# Patient Record
Sex: Male | Born: 1955
Health system: Southern US, Community
[De-identification: ages and names within clinical notes are randomized; demographics above are authoritative.]

## PROBLEM LIST (undated history)

## (undated) DIAGNOSIS — M1990 Unspecified osteoarthritis, unspecified site: Secondary | ICD-10-CM

## (undated) DIAGNOSIS — C61 Malignant neoplasm of prostate: Secondary | ICD-10-CM

## (undated) HISTORY — PX: CARPAL TUNNEL RELEASE: SHX101

## (undated) HISTORY — DX: Unspecified osteoarthritis, unspecified site: M19.90

## (undated) HISTORY — DX: Malignant neoplasm of prostate: C61

## (undated) HISTORY — PX: KNEE ARTHROSCOPY: SUR90

## (undated) HISTORY — PX: MEDIAL PARTIAL KNEE REPLACEMENT: SHX5965

---

## 2008-01-01 ENCOUNTER — Other Ambulatory Visit: Payer: Self-pay

## 2008-01-01 ENCOUNTER — Emergency Department: Payer: Self-pay | Admitting: Emergency Medicine

## 2008-05-11 ENCOUNTER — Emergency Department: Payer: Self-pay | Admitting: Emergency Medicine

## 2010-09-28 HISTORY — PX: BOWEL RESECTION: SHX1257

## 2011-03-28 ENCOUNTER — Emergency Department: Payer: Self-pay | Admitting: Emergency Medicine

## 2012-04-22 ENCOUNTER — Emergency Department: Payer: Self-pay | Admitting: Emergency Medicine

## 2012-04-22 LAB — CBC WITH DIFFERENTIAL/PLATELET
Basophil #: 0.1 10*3/uL (ref 0.0–0.1)
HCT: 44.6 % (ref 40.0–52.0)
Lymphocyte %: 8.6 %
MCHC: 34.6 g/dL (ref 32.0–36.0)
Monocyte %: 4.4 %
Neutrophil %: 86.1 %
Platelet: 170 10*3/uL (ref 150–440)
RDW: 13.1 % (ref 11.5–14.5)
WBC: 13.2 10*3/uL — ABNORMAL HIGH (ref 3.8–10.6)

## 2012-04-22 LAB — URINALYSIS, COMPLETE
Bacteria: NONE SEEN
Blood: NEGATIVE
Glucose,UR: NEGATIVE mg/dL (ref 0–75)
Hyaline Cast: 7
Leukocyte Esterase: NEGATIVE
Nitrite: NEGATIVE
Ph: 5 (ref 4.5–8.0)
WBC UR: 2 /HPF (ref 0–5)

## 2012-04-22 LAB — BASIC METABOLIC PANEL
BUN: 20 mg/dL — ABNORMAL HIGH (ref 7–18)
Calcium, Total: 8.4 mg/dL — ABNORMAL LOW (ref 8.5–10.1)
Co2: 27 mmol/L (ref 21–32)
Creatinine: 1.07 mg/dL (ref 0.60–1.30)
EGFR (African American): >60
EGFR (Non-African Amer.): >60
Glucose: 106 mg/dL — ABNORMAL HIGH (ref 65–99)
Potassium: 4.5 mmol/L (ref 3.5–5.1)
Sodium: 141 mmol/L (ref 136–145)

## 2012-04-22 LAB — DRUG SCREEN, URINE
Amphetamines, Ur Screen: NEGATIVE (ref ?–1000)
Barbiturates, Ur Screen: NEGATIVE (ref ?–200)
Benzodiazepine, Ur Scrn: NEGATIVE (ref ?–200)
Cannabinoid 50 Ng, Ur ~~LOC~~: NEGATIVE (ref ?–50)
Cocaine Metabolite,Ur ~~LOC~~: NEGATIVE (ref ?–300)
MDMA (Ecstasy)Ur Screen: NEGATIVE (ref ?–500)
Methadone, Ur Screen: NEGATIVE (ref ?–300)
Opiate, Ur Screen: NEGATIVE (ref ?–300)
Tricyclic, Ur Screen: NEGATIVE (ref ?–1000)

## 2012-04-22 LAB — CK TOTAL AND CKMB (NOT AT ARMC)
CK, Total: 130 U/L (ref 35–232)
CK-MB: 2 ng/mL (ref 0.5–3.6)

## 2013-10-18 ENCOUNTER — Observation Stay: Payer: Self-pay | Admitting: Surgery

## 2013-10-18 LAB — CBC WITH DIFFERENTIAL/PLATELET
BASOS ABS: 0.1 10*3/uL (ref 0.0–0.1)
BASOS PCT: 0.5 %
Eosinophil #: 0 10*3/uL (ref 0.0–0.7)
Eosinophil %: 0.1 %
HCT: 46.2 % (ref 40.0–52.0)
HGB: 15.2 g/dL (ref 13.0–18.0)
Lymphocyte #: 1.4 10*3/uL (ref 1.0–3.6)
Lymphocyte %: 11 %
MCH: 30.6 pg (ref 26.0–34.0)
MCHC: 33 g/dL (ref 32.0–36.0)
MCV: 93 fL (ref 80–100)
MONO ABS: 0.5 x10 3/mm (ref 0.2–1.0)
Monocyte %: 3.7 %
NEUTROS ABS: 10.6 10*3/uL — AB (ref 1.4–6.5)
Neutrophil %: 84.7 %
PLATELETS: 190 10*3/uL (ref 150–440)
RBC: 4.98 10*6/uL (ref 4.40–5.90)
RDW: 12.7 % (ref 11.5–14.5)
WBC: 12.6 10*3/uL — ABNORMAL HIGH (ref 3.8–10.6)

## 2013-10-18 LAB — URINALYSIS, COMPLETE
Bacteria: NONE SEEN
Bilirubin,UR: NEGATIVE
Blood: NEGATIVE
GLUCOSE, UR: NEGATIVE mg/dL (ref 0–75)
Leukocyte Esterase: NEGATIVE
NITRITE: NEGATIVE
PROTEIN: NEGATIVE
Ph: 5 (ref 4.5–8.0)
SPECIFIC GRAVITY: 1.059 (ref 1.003–1.030)
Squamous Epithelial: NONE SEEN
WBC UR: 1 /HPF (ref 0–5)

## 2013-10-18 LAB — COMPREHENSIVE METABOLIC PANEL
AST: 22 U/L (ref 15–37)
Albumin: 4.4 g/dL (ref 3.4–5.0)
Alkaline Phosphatase: 77 U/L
Anion Gap: 5 — ABNORMAL LOW (ref 7–16)
BUN: 19 mg/dL — AB (ref 7–18)
Bilirubin,Total: 0.6 mg/dL (ref 0.2–1.0)
CO2: 26 mmol/L (ref 21–32)
Calcium, Total: 9.3 mg/dL (ref 8.5–10.1)
Chloride: 107 mmol/L (ref 98–107)
Creatinine: 1.22 mg/dL (ref 0.60–1.30)
EGFR (African American): >60
GLUCOSE: 111 mg/dL — AB (ref 65–99)
Osmolality: 279 (ref 275–301)
Potassium: 4.3 mmol/L (ref 3.5–5.1)
SGPT (ALT): 22 U/L (ref 12–78)
SODIUM: 138 mmol/L (ref 136–145)
TOTAL PROTEIN: 7.8 g/dL (ref 6.4–8.2)

## 2013-10-18 LAB — LIPASE, BLOOD: Lipase: 114 U/L (ref 73–393)

## 2013-10-19 LAB — BASIC METABOLIC PANEL
Anion Gap: 5 — ABNORMAL LOW (ref 7–16)
BUN: 14 mg/dL (ref 7–18)
CHLORIDE: 108 mmol/L — AB (ref 98–107)
Calcium, Total: 8.1 mg/dL — ABNORMAL LOW (ref 8.5–10.1)
Co2: 25 mmol/L (ref 21–32)
Creatinine: 1.01 mg/dL (ref 0.60–1.30)
Glucose: 114 mg/dL — ABNORMAL HIGH (ref 65–99)
Osmolality: 277 (ref 275–301)
Potassium: 4 mmol/L (ref 3.5–5.1)
SODIUM: 138 mmol/L (ref 136–145)

## 2013-10-19 LAB — CBC WITH DIFFERENTIAL/PLATELET
BASOS ABS: 0 10*3/uL (ref 0.0–0.1)
Basophil %: 0.8 %
EOS PCT: 1 %
Eosinophil #: 0.1 10*3/uL (ref 0.0–0.7)
HCT: 41.3 % (ref 40.0–52.0)
HGB: 14 g/dL (ref 13.0–18.0)
LYMPHS PCT: 27.6 %
Lymphocyte #: 1.7 10*3/uL (ref 1.0–3.6)
MCH: 31.5 pg (ref 26.0–34.0)
MCHC: 33.9 g/dL (ref 32.0–36.0)
MCV: 93 fL (ref 80–100)
MONOS PCT: 6.8 %
Monocyte #: 0.4 x10 3/mm (ref 0.2–1.0)
Neutrophil #: 4 10*3/uL (ref 1.4–6.5)
Neutrophil %: 63.8 %
Platelet: 155 10*3/uL (ref 150–440)
RBC: 4.44 10*6/uL (ref 4.40–5.90)
RDW: 12.9 % (ref 11.5–14.5)
WBC: 6.2 10*3/uL (ref 3.8–10.6)

## 2013-11-26 HISTORY — PX: COLONOSCOPY: SHX174

## 2013-12-01 ENCOUNTER — Ambulatory Visit: Payer: Self-pay | Admitting: Gastroenterology

## 2014-01-06 ENCOUNTER — Emergency Department: Payer: Self-pay | Admitting: Emergency Medicine

## 2014-01-06 LAB — CBC WITH DIFFERENTIAL/PLATELET
Basophil #: 0 10*3/uL (ref 0.0–0.1)
Basophil %: 0.7 %
EOS PCT: 1.9 %
Eosinophil #: 0.1 10*3/uL (ref 0.0–0.7)
HCT: 42.7 % (ref 40.0–52.0)
HGB: 14.4 g/dL (ref 13.0–18.0)
LYMPHS PCT: 30.8 %
Lymphocyte #: 2.2 10*3/uL (ref 1.0–3.6)
MCH: 31.5 pg (ref 26.0–34.0)
MCHC: 33.6 g/dL (ref 32.0–36.0)
MCV: 94 fL (ref 80–100)
Monocyte #: 0.5 x10 3/mm (ref 0.2–1.0)
Monocyte %: 7.4 %
Neutrophil #: 4.2 10*3/uL (ref 1.4–6.5)
Neutrophil %: 59.2 %
Platelet: 187 10*3/uL (ref 150–440)
RBC: 4.56 10*6/uL (ref 4.40–5.90)
RDW: 13.1 % (ref 11.5–14.5)
WBC: 7 10*3/uL (ref 3.8–10.6)

## 2014-01-06 LAB — COMPREHENSIVE METABOLIC PANEL
ALBUMIN: 3.8 g/dL (ref 3.4–5.0)
ALK PHOS: 71 U/L
ANION GAP: 0 — AB (ref 7–16)
BILIRUBIN TOTAL: 0.4 mg/dL (ref 0.2–1.0)
BUN: 19 mg/dL — ABNORMAL HIGH (ref 7–18)
Calcium, Total: 8.5 mg/dL (ref 8.5–10.1)
Chloride: 109 mmol/L — ABNORMAL HIGH (ref 98–107)
Co2: 27 mmol/L (ref 21–32)
Creatinine: 0.94 mg/dL (ref 0.60–1.30)
EGFR (Non-African Amer.): >60
GLUCOSE: 100 mg/dL — AB (ref 65–99)
Osmolality: 274 (ref 275–301)
Potassium: 4.3 mmol/L (ref 3.5–5.1)
SGOT(AST): 26 U/L (ref 15–37)
SGPT (ALT): 21 U/L (ref 12–78)
Sodium: 136 mmol/L (ref 136–145)
TOTAL PROTEIN: 7.2 g/dL (ref 6.4–8.2)

## 2014-05-11 ENCOUNTER — Ambulatory Visit: Payer: Self-pay | Admitting: Internal Medicine

## 2015-01-19 NOTE — H&P (Signed)
Subjective/Chief Complaint abdominal pain and distension   History of Present Illness 59 y/o male with hx perforated right colon 3 years ago secondary to ingested foreign body needing an right colectomy.  Did well post op.  Done at [facility].  Arrives today in ER with severe pain starting yesterday am.  Complaints of bloating, no nausea or emesis.  Last BM small today,  admits to some flatus while in ER.  Otherwise health male, except smokes 1 ppd.   Past History see above   Past Med/Surgical Hx:  seizures:   Denies medical history:   bowel repair: perferation of bowel due to swallowing a tooth pick  Carpal Tunnel Release - left wrist:   Knee Surgery - Right:   ALLERGIES:  No Known Allergies:   HOME MEDICATIONS: Medication Instructions Status  Aleve 220 mg oral tablet 2 tab(s) orally once a day Active   Family and Social History:  Family History Non-Contributory   Social History positive  tobacco, negative ETOH, negative Illicit drugs   + Tobacco Current (within 1 year)   Place of Living Home   Review of Systems:  Subjective/Chief Complaint see above   Physical Exam:  GEN no acute distress, thin   HEENT pale conjunctivae, PERRL   NECK supple   RESP normal resp effort  clear BS   CARD regular rate   ABD denies tenderness  no hernia  soft  distended  normal BS  lower midline scar.  somewhat distended.   LYMPH negative neck   SKIN normal to palpation, No rashes   NEURO cranial nerves intact   PSYCH A+O to time, place, person, good insight   Lab Results: Hepatic:  21-Jan-15 11:36   Bilirubin, Total 0.6  Alkaline Phosphatase 77 (45-117 NOTE: New Reference Range 08/18/13)  SGPT (ALT) 22  SGOT (AST) 22  Total Protein, Serum 7.8  Albumin, Serum 4.4  Routine Chem:  21-Jan-15 11:36   BUN  19  Creatinine (comp) 1.22  Sodium, Serum 138  Potassium, Serum 4.3  Chloride, Serum 107  CO2, Serum 26  Calcium (Total), Serum 9.3  Osmolality (calc) 279  eGFR  (African American) >60  eGFR (Non-African American) >60 (eGFR values <93m/min/1.73 m2 may be an indication of chronic kidney disease (CKD). Calculated eGFR is useful in patients with stable renal function. The eGFR calculation will not be reliable in acutely ill patients when serum creatinine is changing rapidly. It is not useful in  patients on dialysis. The eGFR calculation may not be applicable to patients at the low and high extremes of body sizes, pregnant women, and vegetarians.)  Anion Gap  5  Lipase 114 (Result(s) reported on 18 Oct 2013 at 12:09PM.)  Routine UA:  21-Jan-15 16:50   Color (UA) Yellow  Clarity (UA) Clear  Glucose (UA) Negative  Bilirubin (UA) Negative  Ketones (UA) Trace  Specific Gravity (UA) 1.059  Blood (UA) Negative  pH (UA) 5.0  Protein (UA) Negative  Nitrite (UA) Negative  Leukocyte Esterase (UA) Negative (Result(s) reported on 18 Oct 2013 at 05:07PM.)  RBC (UA) 3 /HPF  WBC (UA) 1 /HPF  Bacteria (UA) NONE SEEN  Epithelial Cells (UA) NONE SEEN  Mucous (UA) PRESENT (Result(s) reported on 18 Oct 2013 at 05:07PM.)  Routine Hem:  21-Jan-15 11:36   WBC (CBC)  12.6  RBC (CBC) 4.98  Hemoglobin (CBC) 15.2  Hematocrit (CBC) 46.2  Platelet Count (CBC) 190  MCV 93  MCH 30.6  MCHC 33.0  RDW 12.7  Neutrophil %  84.7  Lymphocyte % 11.0  Monocyte % 3.7  Eosinophil % 0.1  Basophil % 0.5  Neutrophil #  10.6  Lymphocyte # 1.4  Monocyte # 0.5  Eosinophil # 0.0  Basophil # 0.1 (Result(s) reported on 18 Oct 2013 at 11:50AM.)   Radiology Results: XRay:    21-Jan-15 12:45, Abdomen 3 Way Includes PA Chest  Abdomen 3 Way Includes PA Chest  REASON FOR EXAM:    severe pain hx of perf eval for free air  COMMENTS:       PROCEDURE: DXR - DXR ABDOMEN 3-WAY (INCL PA CXR)  - Oct 18 2013 12:45PM     CLINICAL DATA:  Abdominal pain.    EXAM:  ACUTE ABDOMEN SERIES (2 VIEW ABDOMEN AND 1 VIEW CHEST)    COMPARISON:  CT abdomen and pelvis  03/28/2011.    FINDINGS:  Single view of the chest demonstrates clear lungs and normal heart  size. No pneumothorax or pleural fluid.  Two views of the abdomen show no free intraperitoneal air. There are  gas-filled and dilated loops of small bowel measuring up to 3.9 cm  with air-fluid levels present. There is some gas and stool in the  colon. Suture material is seen in the right lower quadrant.     IMPRESSION:  Bowel gas pattern compatible small bowel obstruction. Negative for  free intraperitoneal air.    No acute cardiopulmonary disease.      Electronically Signed    By: Inge Rise M.D.    On: 10/18/2013 13:07     Verified By: Ramond Dial, M.D.,  [place]:  PACS Image    21-Jan-15 14:46, CT Abdomen and Pelvis With Contrast  PACS Image  CT:  CT Abdomen and Pelvis With Contrast  REASON FOR EXAM:    (1) upper abd pain; (2) upper abd pain  COMMENTS:       PROCEDURE: CT  - CT ABDOMEN / PELVIS  W  - Oct 18 2013  2:46PM     CLINICAL DATA:  Upper abdominal pain.    EXAM:  CT ABDOMEN AND PELVIS WITH CONTRAST    TECHNIQUE:  Multidetector CT imaging of the abdomen and pelvis was performed  using the standard protocol following bolus administration of  intravenous contrast.  CONTRAST:  125 mm of Isovue 370.    COMPARISON:  CT of the abdomen and pelvis 03/28/2011.    FINDINGS:  Lung Bases: Atherosclerotic calcifications in the left circumflex  coronary artery. Small hiatal hernia.    Abdomen/Pelvis: The appearance of the liver, gallbladder, pancreas,  spleen, bilateral adrenal glands and bilateral kidneys is  unremarkable. No pneumoperitoneum. Prostate gland and urinary  bladder are unremarkable in appearance. Numerous colonic  diverticulae are noted, without surrounding inflammatory changes to  suggest an acute diverticulitis at this time. No definite  lymphadenopathy identified within the abdomen or pelvis.  There is a suture line in the region of the  cecum, suggesting prior  partial colonic resection. There are multiple prominent loops of  bowel throughout the abdomen measuring up to 3.3 cm in diameter,  many of which are fluid filled. These dilated loops of bowel extend  all the way to the ileal college anastomosis demonstrated on image  41 of series 2, without a discrete transition point. There is 1 loop  of narrowed small bowel with potential bowel wall thickening in the  right side of the abdomen (image 51 of series 2), which could  represent an area of stricturing, however,  bowel on both sides of  this appear equally dilated, indicating that this is unlikely to be  obstructive. There is tethering of multiple loops of small bowel  throughout the abdomen, compatible with underlying adhesions. Some  areas of possible fecalization of small bowel contents are noted.  Trace amount of interloop fluid on series 2, image 66. Notably, the  more proximal small bowel is relatively nondilated. There is some  gas and stool throughout the colon including the distal rectum.    Musculoskeletal: There are no aggressive appearing lytic or blastic  lesions noted in the visualized portions of the skeleton.     IMPRESSION:  1. Although there are multiple air-fluid levels throughout the small  bowel, the small bowel is only mildly dilated, not compatible with  obstruction. This predominantly involves the mid to distal small  bowel, favored to be related to underlying adhesions without frank  obstruction. Additionally, the presence of a trace amount of  interloop fluid may suggest an underlying enteritis. Status post  partial proximal colectomy with a widely patent ileocolonic  anastomosis at this time.  2. Additional incidental findings, as above.  Electronically Signed    By: Vinnie Langton M.D.    On: 10/18/2013 15:33         Verified By: Etheleen Mayhew, M.D.,    Assessment/Admission Diagnosis 59 y/o with sbo, review of CT scan shows  entire small bowel dilated, anastomosis seen,  extensive constipation.   Plan admit, pain control, hold on NG as not having emesis, serial exams, check am films.   Electronic Signatures: Sherri Rad (MD)  (Signed 21-Jan-15 22:13)  Authored: CHIEF COMPLAINT and HISTORY, PAST MEDICAL/SURGIAL HISTORY, ALLERGIES, HOME MEDICATIONS, FAMILY AND SOCIAL HISTORY, REVIEW OF SYSTEMS, PHYSICAL EXAM, LABS, Radiology, ASSESSMENT AND PLAN   Last Updated: 21-Jan-15 22:13 by Sherri Rad (MD)

## 2015-09-10 ENCOUNTER — Encounter: Payer: Self-pay | Admitting: Internal Medicine

## 2015-09-10 ENCOUNTER — Other Ambulatory Visit: Payer: Self-pay | Admitting: Internal Medicine

## 2015-09-10 DIAGNOSIS — F17200 Nicotine dependence, unspecified, uncomplicated: Secondary | ICD-10-CM | POA: Insufficient documentation

## 2015-12-25 IMAGING — CR DG ABDOMEN 3V
1 series · 3 of 3 positions shown · non-contrast
Comparison: CT abdomen and pelvis 03/28/2011.

CLINICAL DATA: Abdominal pain.

EXAM:
ACUTE ABDOMEN SERIES (2 VIEW ABDOMEN AND 1 VIEW CHEST)

[Series 1: w chest pa · 0.14mm/px · 3 of 3 slices shown]
[im 1/3]
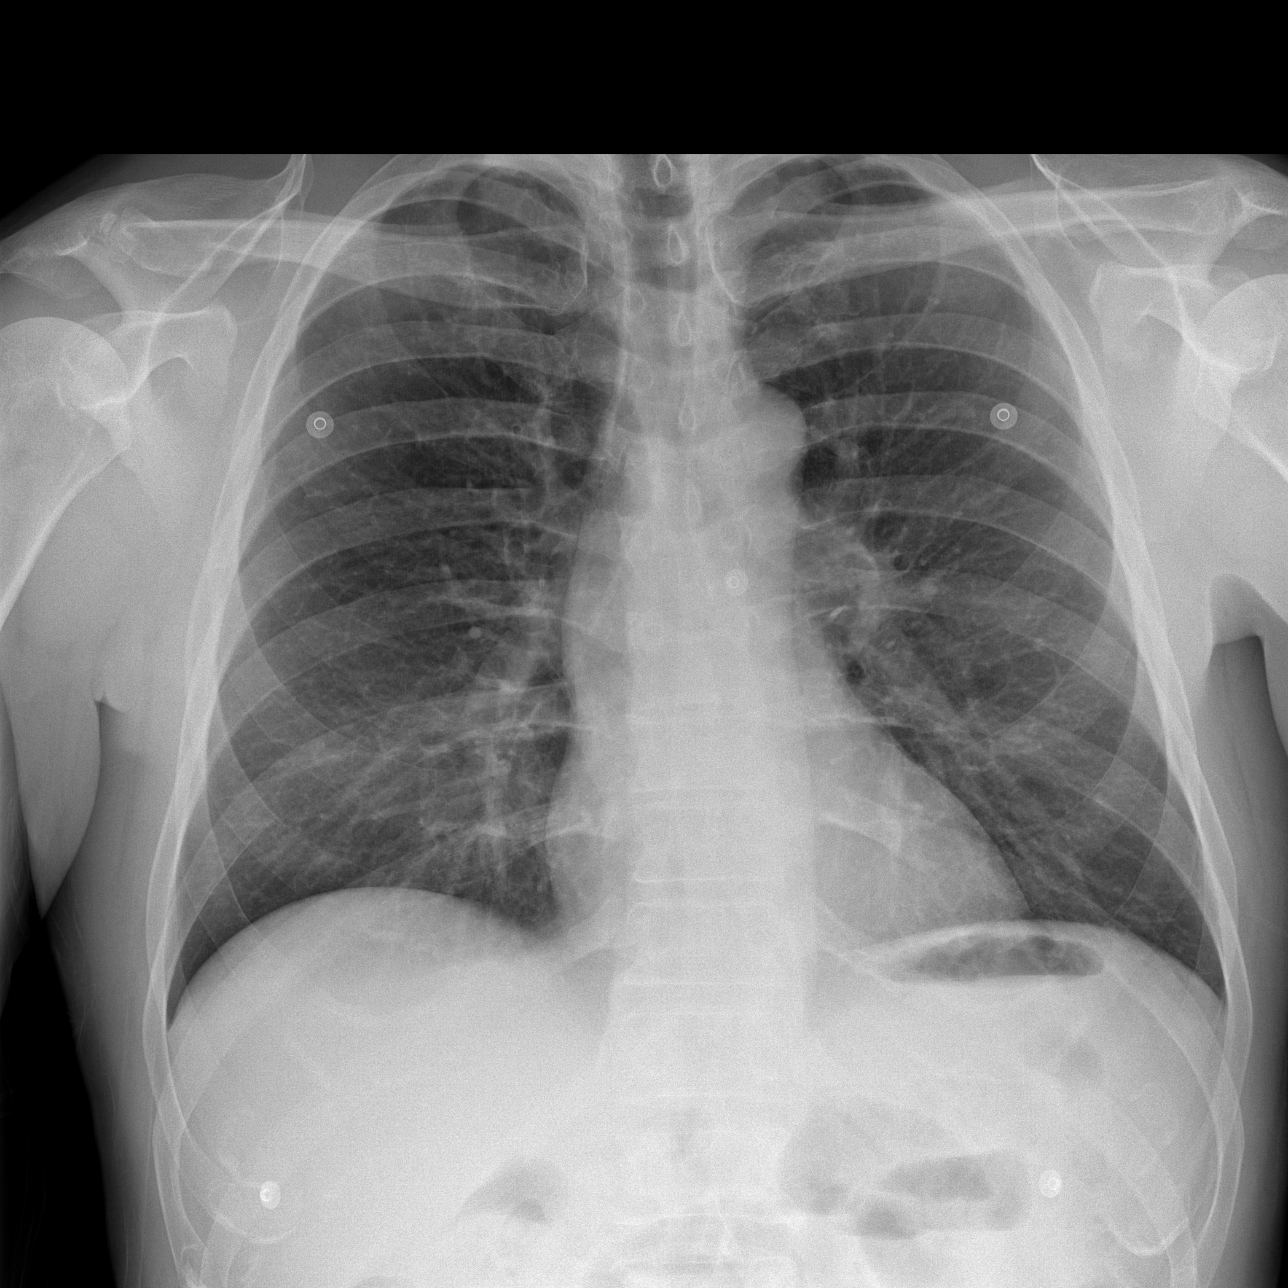
[im 2/3]
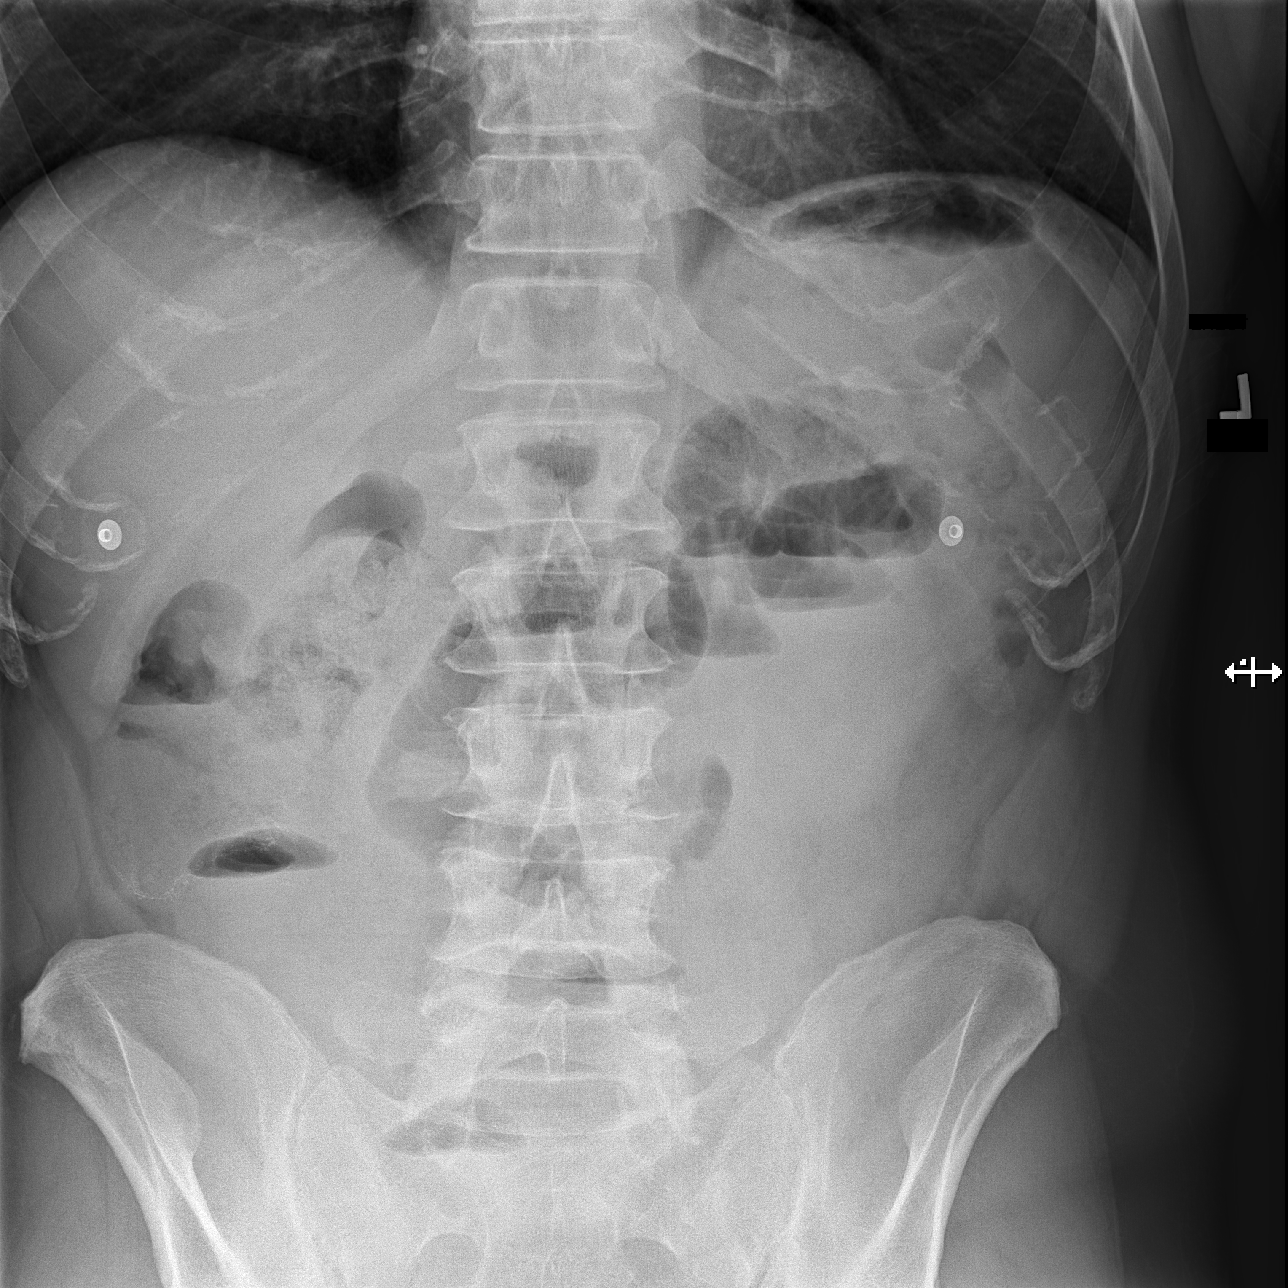
[im 3/3]
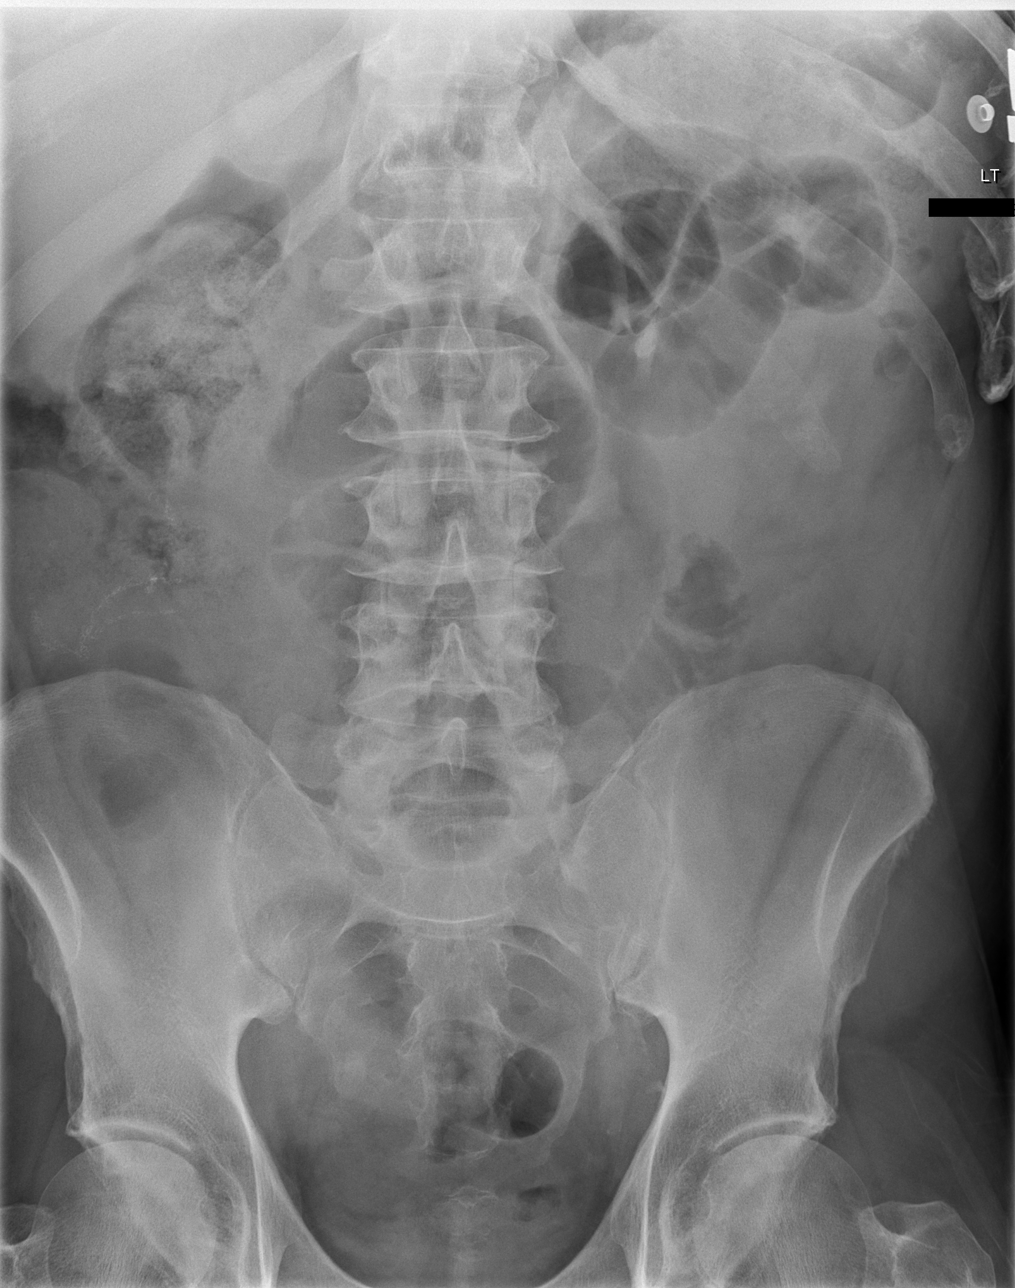

[3 of 3 positions shown; findings below may reference images not displayed]

FINDINGS: Single view of the chest demonstrates clear lungs and normal heart
size. No pneumothorax or pleural fluid.

Two views of the abdomen show no free intraperitoneal air. There are
gas-filled and dilated loops of small bowel measuring up to 3.9 cm
with air-fluid levels present. There is some gas and stool in the
colon. Suture material is seen in the right lower quadrant.
IMPRESSION: Bowel gas pattern compatible small bowel obstruction. Negative for
free intraperitoneal air.

No acute cardiopulmonary disease.

## 2016-07-17 IMAGING — CR DG SHOULDER 3+V*R*
3 series · 3 of 3 positions shown · non-contrast
Comparison: Portable chest x-ray April 22, 2012

CLINICAL DATA: Right shoulder pain.

EXAM:
DG SHOULDER 3+ VIEWS RIGHT

[shoulder grashey]
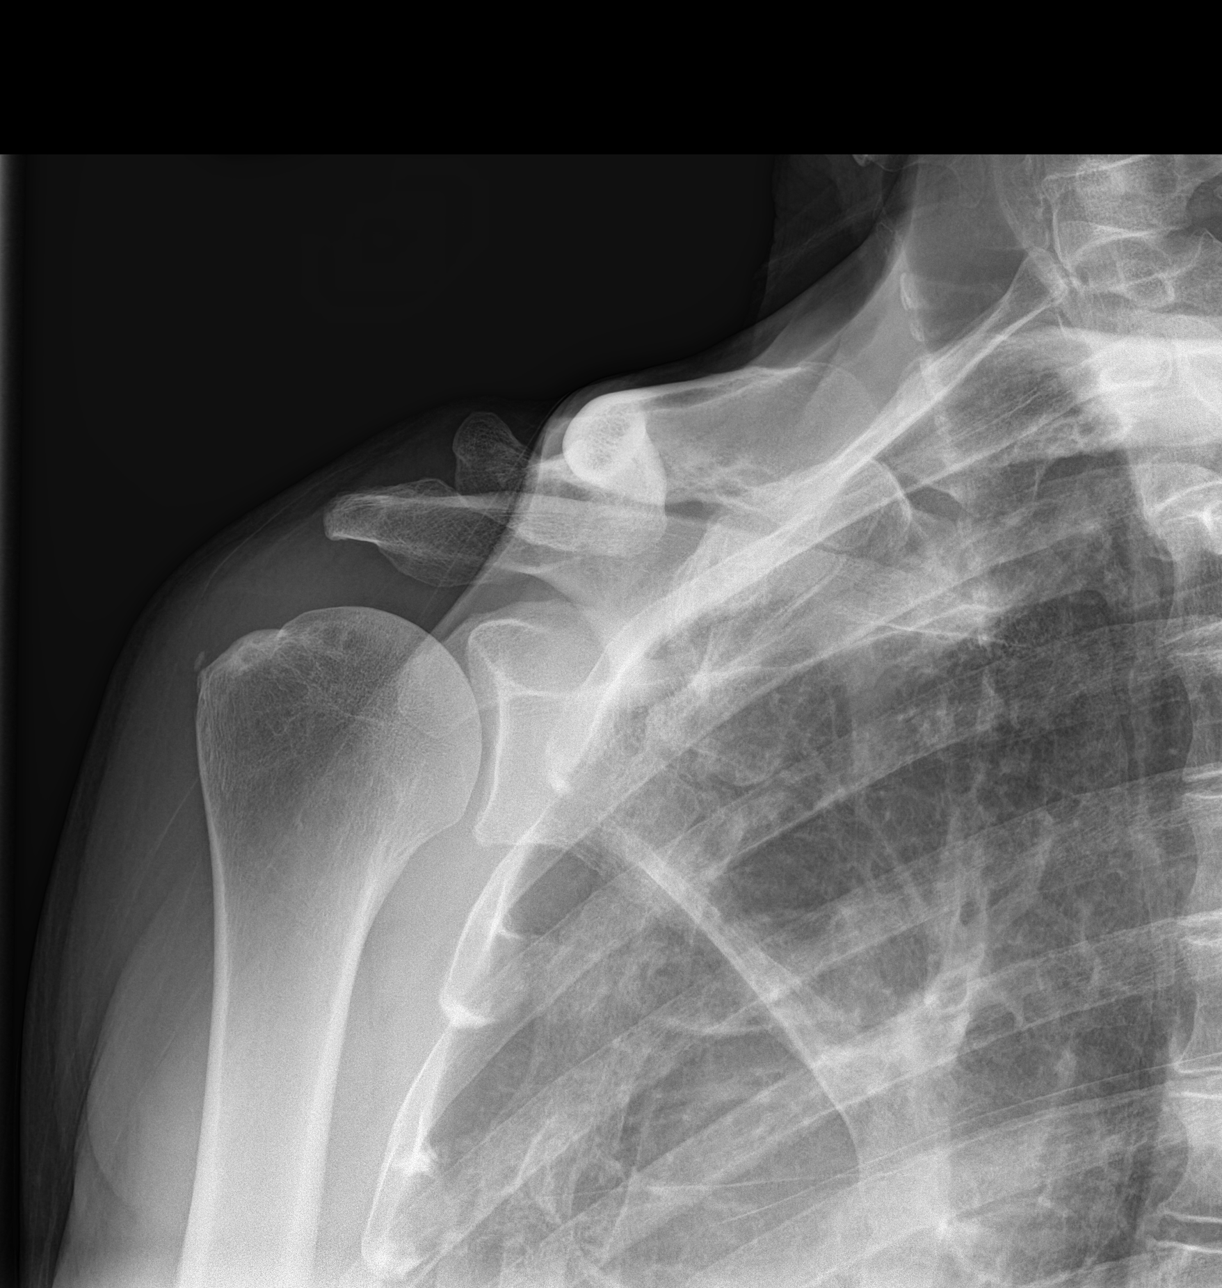

[shoulder y view]
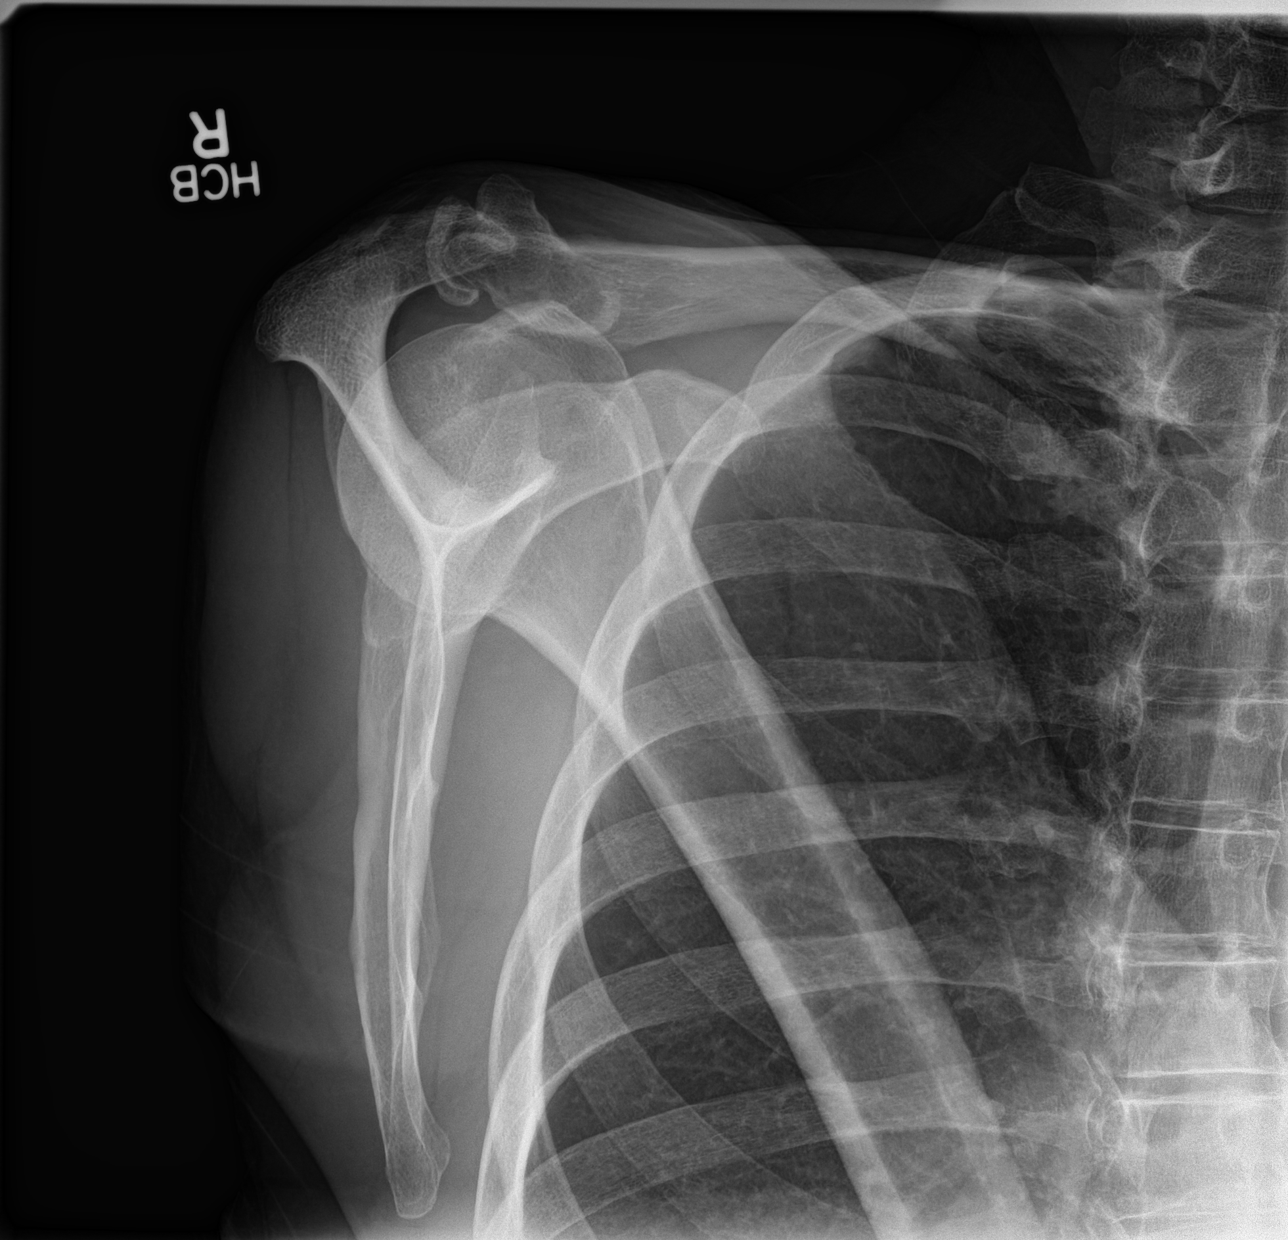

[shoulder axial]
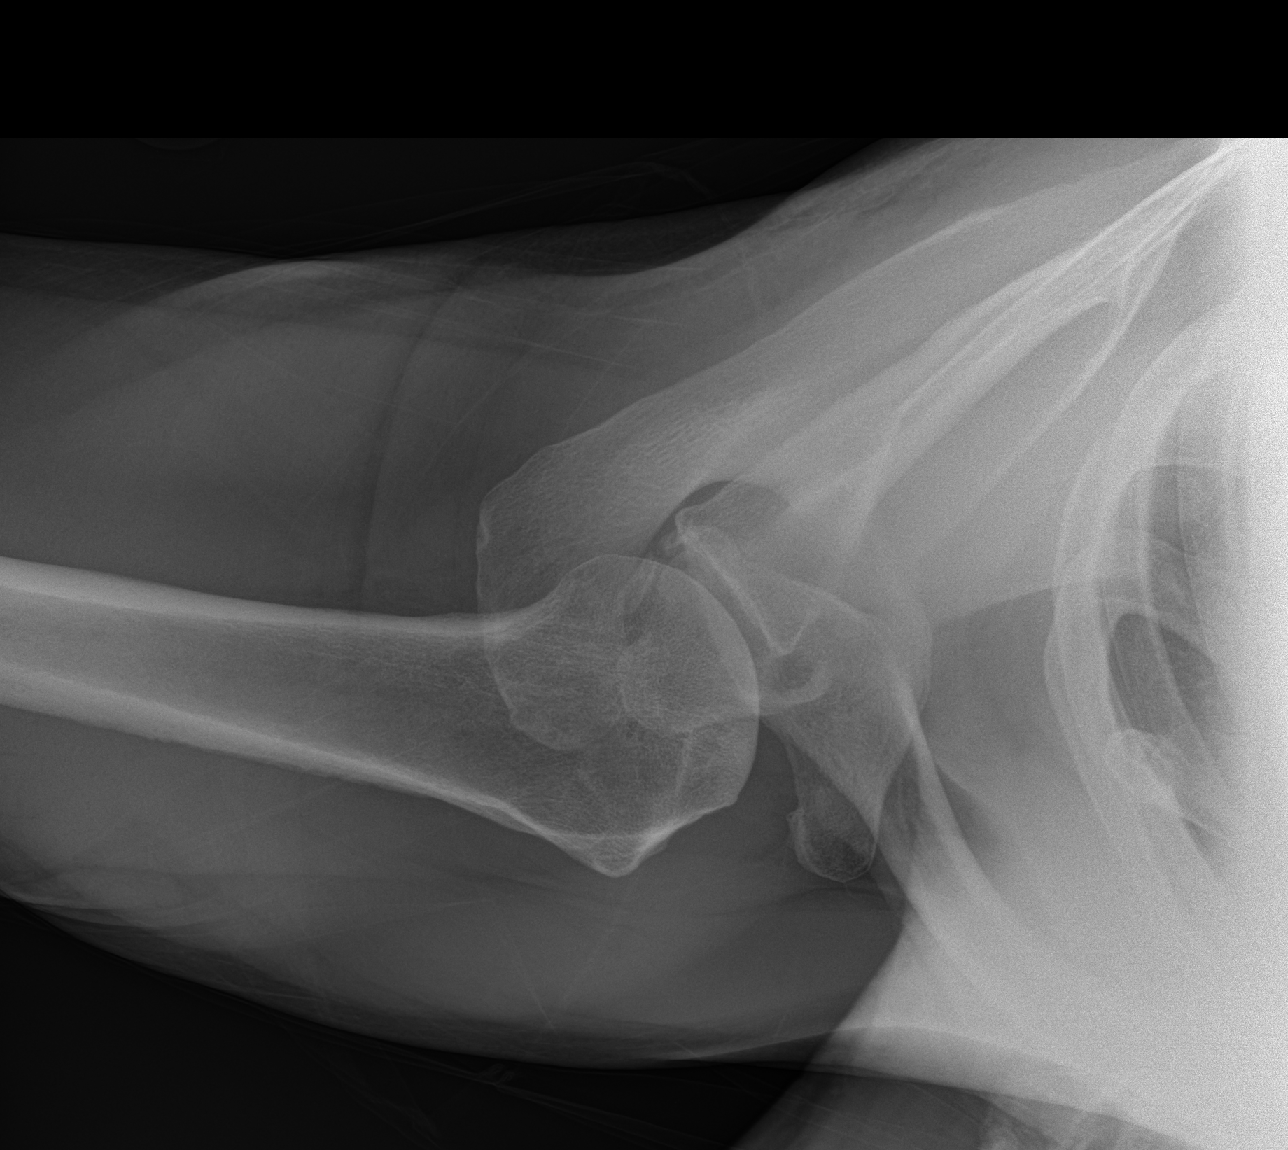

[3 of 3 positions shown; findings below may reference images not displayed]

FINDINGS: The glenohumeral joint is normal. There are degenerative changes of
the AC joint. There is no acute fracture nor dislocation. The
observed portions of the upper right ribs are normal.
IMPRESSION: There are degenerative changes of the AC joint. Otherwise the
examination is within the limits of normal.

## 2016-12-08 ENCOUNTER — Ambulatory Visit
Admission: EM | Admit: 2016-12-08 | Discharge: 2016-12-08 | Disposition: A | Discharge status: Home / Self Care | Payer: Self-pay | Attending: Family Medicine | Admitting: Family Medicine

## 2016-12-08 ENCOUNTER — Encounter: Payer: Self-pay | Admitting: *Deleted

## 2016-12-08 DIAGNOSIS — Z0289 Encounter for other administrative examinations: Secondary | ICD-10-CM

## 2016-12-08 LAB — DEPT OF TRANSP DIPSTICK, URINE (ARMC ONLY)
GLUCOSE, UA: NEGATIVE mg/dL
Hgb urine dipstick: NEGATIVE
PROTEIN: NEGATIVE mg/dL
Specific Gravity, Urine: <1.005 — ABNORMAL LOW (ref 1.005–1.030)

## 2016-12-08 NOTE — ED Provider Notes (Signed)
[facility]    CSN: [id number] Arrival date & time: 12/08/16  1201     History   Chief Complaint Chief Complaint  Patient presents with  . Commercial Driver's License Exam    HPI Jesse Collier is a 61 y.o. male.   Patient here for DOT Physical (see scanned form)   The history is provided by the patient.    History reviewed. No pertinent past medical history.  Patient Active Problem List   Diagnosis Date Noted  . Compulsive tobacco user syndrome 09/10/2015    Past Surgical History:  Procedure Laterality Date  . BOWEL RESECTION  2012   swallowed toothpick  . CARPAL TUNNEL RELEASE Left   . COLONOSCOPY  11/2013   ulcer of terminal ileum  . KNEE ARTHROSCOPY Right        Home Medications    Prior to Admission medications   Medication Sig Start Date End Date Taking? Authorizing Provider  Naproxen Sodium (ALEVE) 220 MG CAPS Take by mouth.    Historical Provider, MD    Family History Family History  Problem Relation Age of Onset  . CAD Mother     Social History Social History  Substance Use Topics  . Smoking status: Current Every Day Smoker    Packs/day: 0.50  . Smokeless tobacco: Never Used  . Alcohol use No     Allergies   Patient has no known allergies.   Review of Systems Review of Systems   Physical Exam Triage Vital Signs ED Triage Vitals  Enc Vitals Group     BP 12/08/16 1242 127/62     Pulse Rate 12/08/16 1242 72     Resp 12/08/16 1242 16     Temp 12/08/16 1242 98.2 F (36.8 C)     Temp Source 12/08/16 1242 Oral     SpO2 12/08/16 1242 99 %     Weight 12/08/16 1244 189 lb (85.7 kg)     Height 12/08/16 1244 5\' 11"  (1.803 m)     Head Circumference --      Peak Flow --      Pain Score 12/08/16 1246 0     Pain Loc --      Pain Edu? --      Excl. in [place]? --    No data found.   Updated Vital Signs BP 127/62 (BP Location: Left Arm)   Pulse 72   Temp 98.2 F (36.8 C) (Oral)   Resp 16   Ht 5\' 11"  (1.803 m)   Wt  189 lb (85.7 kg)   SpO2 99%   BMI 26.36 kg/m   Visual Acuity Right Eye Distance:   Left Eye Distance:   Bilateral Distance:    Right Eye Near:   Left Eye Near:    Bilateral Near:     Physical Exam   UC Treatments / Results  Labs (all labs ordered are listed, but only abnormal results are displayed) Labs Reviewed  DEPT OF TRANSP DIPSTICK, URINE([facility] ONLY) - Abnormal; Notable for the following:       Result Value   Specific Gravity, Urine <1.005 (*)    All other components within normal limits    EKG  EKG Interpretation None       Radiology No results found.  Procedures Procedures (including critical care time)  Medications Ordered in UC Medications - No data to display   Initial Impression / Assessment and Plan / UC Course  I have reviewed the triage vital signs and  the nursing notes.  Pertinent labs & imaging results that were available during my care of the patient were reviewed by me and considered in my medical decision making (see chart for details).       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

## 2016-12-08 NOTE — ED Triage Notes (Signed)
Commercial drivers license physical exam 

## 2019-09-19 ENCOUNTER — Ambulatory Visit: Payer: Managed Care, Other (non HMO) | Attending: Internal Medicine

## 2019-09-19 DIAGNOSIS — Z20822 Contact with and (suspected) exposure to covid-19: Secondary | ICD-10-CM

## 2019-09-20 LAB — NOVEL CORONAVIRUS, NAA: SARS-CoV-2, NAA: NOT DETECTED

## 2020-01-06 ENCOUNTER — Ambulatory Visit: Payer: Managed Care, Other (non HMO) | Attending: Internal Medicine

## 2020-01-06 DIAGNOSIS — Z23 Encounter for immunization: Secondary | ICD-10-CM

## 2020-01-06 NOTE — Progress Notes (Signed)
   Covid-19 Vaccination Clinic  Name:  Jesse Collier    MRN: [id number] DOB: [birth year]  01/06/2020  Mr. Jesse Collier was observed post Covid-19 immunization for 15 minutes without incident. He was provided with Vaccine Information Sheet and instruction to access the V-Safe system.   Mr. Jesse Collier was instructed to call 911 with any severe reactions post vaccine: Marland Kitchen Difficulty breathing  . Swelling of face and throat  . A fast heartbeat  . A bad rash all over body  . Dizziness and weakness   Immunizations Administered    Name Date Dose VIS Date Route   Pfizer COVID-19 Vaccine 01/06/2020  9:29 AM 0.3 mL 09/08/2019 Intramuscular   Manufacturer: [place]   Lot: [id number]   [place]: [id number]

## 2020-01-31 ENCOUNTER — Ambulatory Visit: Payer: Managed Care, Other (non HMO) | Attending: Internal Medicine

## 2020-01-31 DIAGNOSIS — Z23 Encounter for immunization: Secondary | ICD-10-CM

## 2020-01-31 NOTE — Progress Notes (Signed)
   Covid-19 Vaccination Clinic  Name:  Jesse Collier    MRN: [id number] DOB: [birth year]  01/31/2020  Jesse Collier was observed post Covid-19 immunization for 15 minutes without incident. He was provided with Vaccine Information Sheet and instruction to access the V-Safe system.   Jesse Collier was instructed to call 911 with any severe reactions post vaccine: Marland Kitchen Difficulty breathing  . Swelling of face and throat  . A fast heartbeat  . A bad rash all over body  . Dizziness and weakness   Immunizations Administered    Name Date Dose VIS Date Route   Pfizer COVID-19 Vaccine 01/31/2020  9:58 AM 0.3 mL 11/22/2018 Intramuscular   Manufacturer: [place]   Lot: [id number]   [place]: [id number]

## 2021-02-03 ENCOUNTER — Encounter: Payer: Self-pay | Admitting: Family Medicine

## 2021-02-03 ENCOUNTER — Ambulatory Visit (INDEPENDENT_AMBULATORY_CARE_PROVIDER_SITE_OTHER): Payer: Medicare Other | Admitting: Family Medicine

## 2021-02-03 ENCOUNTER — Other Ambulatory Visit: Payer: Self-pay

## 2021-02-03 ENCOUNTER — Other Ambulatory Visit: Payer: Self-pay | Admitting: Family Medicine

## 2021-02-03 VITALS — BP 135/52 | HR 59 | Ht 71.0 in | Wt 204.8 lb

## 2021-02-03 DIAGNOSIS — R202 Paresthesia of skin: Secondary | ICD-10-CM

## 2021-02-03 DIAGNOSIS — Z7689 Persons encountering health services in other specified circumstances: Secondary | ICD-10-CM | POA: Diagnosis not present

## 2021-02-03 DIAGNOSIS — M2550 Pain in unspecified joint: Secondary | ICD-10-CM

## 2021-02-03 DIAGNOSIS — R7309 Other abnormal glucose: Secondary | ICD-10-CM

## 2021-02-03 DIAGNOSIS — Z0000 Encounter for general adult medical examination without abnormal findings: Secondary | ICD-10-CM

## 2021-02-03 DIAGNOSIS — M1909 Primary osteoarthritis, other specified site: Secondary | ICD-10-CM | POA: Diagnosis not present

## 2021-02-03 DIAGNOSIS — Z1211 Encounter for screening for malignant neoplasm of colon: Secondary | ICD-10-CM | POA: Diagnosis not present

## 2021-02-03 DIAGNOSIS — N401 Enlarged prostate with lower urinary tract symptoms: Secondary | ICD-10-CM

## 2021-02-03 DIAGNOSIS — Z1159 Encounter for screening for other viral diseases: Secondary | ICD-10-CM

## 2021-02-03 DIAGNOSIS — M1990 Unspecified osteoarthritis, unspecified site: Secondary | ICD-10-CM

## 2021-02-03 DIAGNOSIS — R351 Nocturia: Secondary | ICD-10-CM

## 2021-02-03 DIAGNOSIS — Z114 Encounter for screening for human immunodeficiency virus [HIV]: Secondary | ICD-10-CM

## 2021-02-03 DIAGNOSIS — G8929 Other chronic pain: Secondary | ICD-10-CM | POA: Diagnosis not present

## 2021-02-03 HISTORY — DX: Benign prostatic hyperplasia with lower urinary tract symptoms: N40.1

## 2021-02-03 HISTORY — DX: Unspecified osteoarthritis, unspecified site: M19.90

## 2021-02-03 MED ORDER — SAW PALMETTO (SERENOA REPENS) 80 MG PO CAPS: 80.0000 mg | ORAL_CAPSULE | Freq: Two times a day (BID) | ORAL | Status: DC

## 2021-02-03 MED ORDER — GABAPENTIN 100 MG PO CAPS: ORAL_CAPSULE | ORAL | 1 refills | Status: DC

## 2021-02-03 NOTE — Progress Notes (Signed)
Subjective:    Patient ID: Jesse Collier, male    DOB: 23-Jun-1956, 65 y.o.   MRN: [id number]  Jesse Collier is a 65 y.o. male presenting on 02/03/2021 for Establish Care  Previously followed by Dr Laneta Simmers at [facility]  Local to [place]  HPI   Osteoarthritis multiple joints Neuropathy, sensory He has had orthopedic consultation, was not diagnosed w/ Rheumatoid arthritis. Also seen [facility] in [place]. He has seen Ortho in [facility] - Dr Berenice Primas 5 years ago R partial knee replacement.  He has daily arthritis joint pain episodic No significant debilitation but he is in pain daily He is able to function working part time  He is prescribed Celebrex 100mg  BID. He takes most often. He does swap for Advil 400mg  in future PRN, if he takes one he does not take the other. - Tried OTC Blue Emu, [place] Cream, Voltaren topical, Tylenol. - Locations include feet, knees, hands - In AM hands are stiff and have some stiffness with movement - He has worked as Aeronautical engineer for years, prolong standing, not as much lifting - If not work he is staying active with golf, yard work Social research officer, government. - Admits mild tingling in bottoms of feet, very mild. Not painful.  He had accidental issue 2010, swallowed whole tooth pick, emergency seen at [facility] and identified toothpick whole, perforated bowel, required bowel resection.  BPH Nocturia Chronic problem with some BPH symptoms mostly with nocturia waking up few times to void. Not impacting him much during day. Has failed tamsulosin or other alpha blocker made him sedated or groggy.  Health Maintenance:  Colon CA Screening - Previous Colonoscopy due to emergency issue 2010, now 12 years later due for repeat, asymptomatic.   Depression screen [facility] 2/9 02/03/2021  Decreased Interest 0  Down, Depressed, Hopeless 0  PHQ - 2 Score 0  Altered sleeping 1  Tired, decreased energy 0  Change in appetite 0  Feeling bad or failure about  yourself  0  Trouble concentrating 0  Moving slowly or fidgety/restless 0  Suicidal thoughts 0  PHQ-9 Score 1  Difficult doing work/chores Not difficult at all    Past Medical History:  Diagnosis Date  . Arthritis   . Prostate cancer [facility])    Past Surgical History:  Procedure Laterality Date  . BOWEL RESECTION  2012   swallowed toothpick  . CARPAL TUNNEL RELEASE Left   . COLONOSCOPY  11/2013   ulcer of terminal ileum  . KNEE ARTHROSCOPY Right   . MEDIAL PARTIAL KNEE REPLACEMENT Right    Social History   Socioeconomic History  . Marital status: Married    Spouse name: Not on file  . Number of children: Not on file  . Years of education: Not on file  . Highest education level: Not on file  Occupational History  . Not on file  Tobacco Use  . Smoking status: Current Every Day Smoker    Packs/day: 0.25  . Smokeless tobacco: Never Used  Substance and Sexual Activity  . Alcohol use: No    Alcohol/week: 0.0 standard drinks  . Drug use: No  . Sexual activity: Not on file  Other Topics Concern  . Not on file  Social History Narrative  . Not on file   Social Determinants of Health   Financial Resource Strain: Not on file  Food Insecurity: Not on file  Transportation Needs: Not on file  Physical Activity: Not on file  Stress: Not on file  Social Connections: Not on file  Intimate Partner Violence: Not on file   Family History  Problem Relation Age of Onset  . CAD Mother   . Colon cancer Neg Hx   . Prostate cancer Neg Hx    Current Outpatient Medications on File Prior to Visit  Medication Sig  . celecoxib (CELEBREX) 100 MG capsule Take 100 mg by mouth in the morning and at bedtime.  Marland Kitchen ibuprofen (ADVIL) 200 MG tablet Take 1-2 tablets (200-400 mg total) by mouth 2 (two) times daily as needed.   No current facility-administered medications on file prior to visit.    Review of Systems Per HPI unless specifically indicated above      Objective:    BP (!)  135/52   Pulse (!) 59   Ht 5\' 11"  (1.803 m)   Wt 204 lb 12.8 oz (92.9 kg)   SpO2 98%   BMI 28.56 kg/m   Wt Readings from Last 3 Encounters:  02/03/21 204 lb 12.8 oz (92.9 kg)  12/08/16 189 lb (85.7 kg)    Physical Exam Vitals and nursing note reviewed.  Constitutional:      General: He is not in acute distress.    Appearance: He is well-developed. He is not diaphoretic.     Comments: Well-appearing, comfortable, cooperative  HENT:     Head: Normocephalic and atraumatic.  Eyes:     General:        Right eye: No discharge.        Left eye: No discharge.     Conjunctiva/sclera: Conjunctivae normal.  Cardiovascular:     Rate and Rhythm: Normal rate.  Pulmonary:     Effort: Pulmonary effort is normal.  Skin:    General: Skin is warm and dry.     Findings: No erythema or rash.  Neurological:     Mental Status: He is alert and oriented to person, place, and time.  Psychiatric:        Behavior: Behavior normal.     Comments: Well groomed, good eye contact, normal speech and thoughts    Results for orders placed or performed in visit on 09/19/19  Novel Coronavirus, NAA ([facility])   Specimen: Nasopharyngeal(NP) swabs in vial transport medium   NASOPHARYNGE  TESTING  Result Value Ref Range   SARS-CoV-2, NAA Not Detected Not Detected      Assessment & Plan:   Problem List Items Addressed This Visit    Tingling of both feet   Relevant Medications   gabapentin (NEURONTIN) 100 MG capsule   DJD (degenerative joint disease) - Primary   Relevant Medications   celecoxib (CELEBREX) 100 MG capsule   ibuprofen (ADVIL) 200 MG tablet   gabapentin (NEURONTIN) 100 MG capsule   BPH associated with nocturia   Relevant Medications   saw palmetto 80 MG capsule    Other Visit Diagnoses    Encounter to establish care with new doctor       Chronic joint pain       Relevant Medications   celecoxib (CELEBREX) 100 MG capsule   ibuprofen (ADVIL) 200 MG tablet   gabapentin (NEURONTIN) 100  MG capsule   Screening for colon cancer          Due for routine colon cancer screening. Last colonoscopy due to other reasons with bowel perforation 10 yr ago - Discussion today about recommendations for either Colonoscopy or Cologuard screening, benefits and risks of screening, interested in Cologuard, understands that if positive then recommendation is for  diagnostic colonoscopy to follow-up. - Ordered Cologuard today  #Osteoarthritis multiple joints Chronic problem multiple joints, both sides of body Prior work up Rheum negative Ortho R knee partial arthroplasty Takes Celebrex 100mg  BID and can substitute a dose of celebrex for advil 200-400mg  PRN sometimes. Failed other therapy tylenol, voltaren topical creams Continue current therapy Will offer future options w/ update x-rays, medications, injections, and consider Ortho vs PT Start Gabapentin titration caution sedation dosing up to 300mg  as advised  #Tingling, feet Uncertain exact etiology can be related to arthritis, prolonged standing on feet and other components No history to suggest type 2 diabetes or other nerve injury Will check with blood work. Start Gabapentin titration can ease symptoms  #BPH Nocturia Failed alpha blocker previously sedated Trial on OTC Saw palmetto 80-160mg  BID  Orders Placed This Encounter  Procedures  . Cologuard     Meds ordered this encounter  Medications  . saw palmetto 80 MG capsule    Sig: Take 1-2 capsules (80-160 mg total) by mouth 2 (two) times daily.  Marland Kitchen gabapentin (NEURONTIN) 100 MG capsule    Sig: Start 1 capsule daily, increase by 1 cap every 2-3 days as tolerated up to 3 times a day, or may take 3 at once in evening.    Dispense:  90 capsule    Refill:  1      Follow up plan: Return in about 4 weeks (around 03/03/2021) for 4 weeks fasting lab only in AM then can do PM Physical 1 week later when available approx.   Future labs 02/26/21 order labs, HIV Hep C added.  PSA.  [name], [facility] 02/03/2021, 10:22 AM

## 2021-02-03 NOTE — Patient Instructions (Addendum)
Thank you for coming to the office today.  Start Gabapentin 100mg  capsules, take at night for 2-3 nights only, and then increase to 2 times a day for a few days, and then may increase to 3 times a day, it may make you drowsy, if helps significantly at night only, then you can increase instead to 3 capsules at night, instead of 3 times a day - In the future if needed, we can significantly increase the dose if tolerated well, some common doses are 300mg  three times a day up to 600mg  three times a day, usually it takes several weeks or months to get to higher doses  Saw Palmetto 80-160mg  twice a day.  DUE for FASTING BLOOD WORK (no food or drink after midnight before the lab appointment, only water or coffee without cream/sugar on the morning of)  SCHEDULE "Lab Only" visit in the morning at the clinic for lab draw in 4 WEEKS  - Make sure Lab Only appointment is at about 1 week before your next appointment, so that results will be available  For Lab Results, once available within 2-3 days of blood draw, you can can log in to MyChart online to view your results and a brief explanation. Also, we can discuss results at next follow-up visit.   Please schedule a Follow-up Appointment to: Return in about 4 weeks (around 03/03/2021) for 4 weeks fasting lab only in AM then can do PM Physical 1 week later when available approx.  If you have any other questions or concerns, please feel free to call the office or send a message through [place]. You may also schedule an earlier appointment if necessary.  Additionally, you may be receiving a survey about your experience at our office within a few days to 1 week by e-mail or mail. We value your feedback.  [name], DO [place]

## 2021-02-10 ENCOUNTER — Telehealth: Payer: Self-pay | Admitting: Family Medicine

## 2021-02-10 ENCOUNTER — Ambulatory Visit: Payer: Self-pay | Admitting: *Deleted

## 2021-02-10 DIAGNOSIS — G459 Transient cerebral ischemic attack, unspecified: Secondary | ICD-10-CM | POA: Diagnosis not present

## 2021-02-10 DIAGNOSIS — G629 Polyneuropathy, unspecified: Secondary | ICD-10-CM | POA: Diagnosis not present

## 2021-02-10 DIAGNOSIS — R404 Transient alteration of awareness: Secondary | ICD-10-CM | POA: Diagnosis not present

## 2021-02-10 DIAGNOSIS — M1991 Primary osteoarthritis, unspecified site: Secondary | ICD-10-CM | POA: Diagnosis not present

## 2021-02-10 DIAGNOSIS — R410 Disorientation, unspecified: Secondary | ICD-10-CM | POA: Diagnosis not present

## 2021-02-10 NOTE — Telephone Encounter (Signed)
Patient 's wife called to report patient came home from playing golf and was acting confused. Patient forgetting events from this am . Patient's wife reports patient could not remember playing golf this am and forgetting what he was going to do from a few minutes before, ex going to look for keys and forgetting what he went to go look for. Patients's wife reports she is not sure of last time patient took gabapentin but does not think if was last night. Patient has reported gabapentin makes him too drowsy. Patient's wife recommended to take patient to ED now and wife wanted to notify PCP. Called [facility] and reviewed symptom of confusion and no c/o any other symptoms. FC in agreement to have patient assessed in ED now . Encouraged patient's wife to call 911. Care advise given. Patient's wife verbalized understanding of care advise and to call 911.  Reason for Disposition . [1] Difficult to awaken or acting confused (e.g., disoriented, slurred speech) AND [2] present now AND [3] new-onset  Answer Assessment - Initial Assessment Questions 1. LEVEL OF CONSCIOUSNESS: "How is he (she, the patient) acting right now?" (e.g., alert-oriented, confused, lethargic, stuporous, comatose)     Confused  2. ONSET: "When did the confusion start?"  (minutes, hours, days)     Today approx. Around 1:50pm after playing golf 3. PATTERN "Does this come and go, or has it been constant since it started?"  "Is it present now?"     Present now. 4. ALCOHOL or DRUGS: "Has he been drinking alcohol or taking any drugs?"      na 5. NARCOTIC MEDICATIONS: "Has he been receiving any narcotic medications?" (e.g., morphine, Vicodin)     No only taking celebrex and gabopentin 6. CAUSE: "What do you think is causing the confusion?"      No maybe gabopentin 7. OTHER SYMPTOMS: "Are there any other symptoms?" (e.g., difficulty breathing, headache, fever, weakness)     No  Protocols used: [place]

## 2021-02-10 NOTE — Telephone Encounter (Signed)
Note not needed 

## 2021-02-11 NOTE — Telephone Encounter (Signed)
I agree with ED evaluation for new onset acute confusion. This is not a symptom or related concern that I was aware of. He recently established care only. It is possible that the Gabapentin can cause some sedation and maybe confusion but it should not be that noticeable or significant. He can try coming off this medication but I agree ED evaluation promptly is most important to rule out stroke or other concerns.  Jesse Collier, [facility] 02/11/2021, 8:26 AM

## 2021-02-26 ENCOUNTER — Other Ambulatory Visit: Payer: Medicare Other

## 2021-02-26 ENCOUNTER — Other Ambulatory Visit: Payer: Self-pay

## 2021-02-26 DIAGNOSIS — E7800 Pure hypercholesterolemia, unspecified: Secondary | ICD-10-CM | POA: Diagnosis not present

## 2021-02-26 DIAGNOSIS — M1909 Primary osteoarthritis, other specified site: Secondary | ICD-10-CM

## 2021-02-26 DIAGNOSIS — R7309 Other abnormal glucose: Secondary | ICD-10-CM

## 2021-02-26 DIAGNOSIS — N401 Enlarged prostate with lower urinary tract symptoms: Secondary | ICD-10-CM

## 2021-02-26 DIAGNOSIS — Z0000 Encounter for general adult medical examination without abnormal findings: Secondary | ICD-10-CM

## 2021-02-26 DIAGNOSIS — Z114 Encounter for screening for human immunodeficiency virus [HIV]: Secondary | ICD-10-CM

## 2021-02-26 DIAGNOSIS — Z1159 Encounter for screening for other viral diseases: Secondary | ICD-10-CM | POA: Diagnosis not present

## 2021-02-27 LAB — COMPLETE METABOLIC PANEL WITH GFR
AG Ratio: 2 (calc) (ref 1.0–2.5)
ALT: 11 U/L (ref 9–46)
AST: 17 U/L (ref 10–35)
Albumin: 4.5 g/dL (ref 3.6–5.1)
Alkaline phosphatase (APISO): 77 U/L (ref 35–144)
BUN: 21 mg/dL (ref 7–25)
CO2: 25 mmol/L (ref 20–32)
Calcium: 9.2 mg/dL (ref 8.6–10.3)
Chloride: 106 mmol/L (ref 98–110)
Creat: 1.07 mg/dL (ref 0.70–1.25)
GFR, Est African American: 84 mL/min/{1.73_m2} (ref >=60)
GFR, Est Non African American: 72 mL/min/{1.73_m2} (ref >=60)
Globulin: 2.3 g/dL (calc) (ref 1.9–3.7)
Glucose, Bld: 80 mg/dL (ref 65–99)
Potassium: 4.3 mmol/L (ref 3.5–5.3)
Sodium: 140 mmol/L (ref 135–146)
Total Bilirubin: 0.6 mg/dL (ref 0.2–1.2)
Total Protein: 6.8 g/dL (ref 6.1–8.1)

## 2021-02-27 LAB — CBC WITH DIFFERENTIAL/PLATELET
Absolute Monocytes: 400 cells/uL (ref 200–950)
Basophils Absolute: 31 cells/uL (ref 0–200)
Basophils Relative: 0.6 %
Eosinophils Absolute: 0 cells/uL — ABNORMAL LOW (ref 15–500)
Eosinophils Relative: 0 %
HCT: 45.7 % (ref 38.5–50.0)
Hemoglobin: 14.5 g/dL (ref 13.2–17.1)
Lymphs Abs: 1596 cells/uL (ref 850–3900)
MCH: 30.8 pg (ref 27.0–33.0)
MCHC: 31.7 g/dL — ABNORMAL LOW (ref 32.0–36.0)
MCV: 97 fL (ref 80.0–100.0)
MPV: 11.4 fL (ref 7.5–12.5)
Monocytes Relative: 7.7 %
Neutro Abs: 3172 cells/uL (ref 1500–7800)
Neutrophils Relative %: 61 %
Platelets: 132 10*3/uL — ABNORMAL LOW (ref 140–400)
RBC: 4.71 10*6/uL (ref 4.20–5.80)
RDW: 12 % (ref 11.0–15.0)
Total Lymphocyte: 30.7 %
WBC: 5.2 10*3/uL (ref 3.8–10.8)

## 2021-02-27 LAB — HEMOGLOBIN A1C
Hgb A1c MFr Bld: 5.5 % of total Hgb (ref <5.7)
Mean Plasma Glucose: 111 mg/dL
eAG (mmol/L): 6.2 mmol/L

## 2021-02-27 LAB — LIPID PANEL
Cholesterol: 195 mg/dL (ref <200)
HDL: 55 mg/dL (ref >=40)
LDL Cholesterol (Calc): 112 mg/dL (calc) — ABNORMAL HIGH
Non-HDL Cholesterol (Calc): 140 mg/dL (calc) — ABNORMAL HIGH (ref <130)
Total CHOL/HDL Ratio: 3.5 (calc) (ref <5.0)
Triglycerides: 162 mg/dL — ABNORMAL HIGH (ref <150)

## 2021-02-27 LAB — HEPATITIS C ANTIBODY
Hepatitis C Ab: NONREACTIVE
SIGNAL TO CUT-OFF: 0.03 (ref <1.00)

## 2021-02-27 LAB — HIV ANTIBODY (ROUTINE TESTING W REFLEX): HIV 1&2 Ab, 4th Generation: NONREACTIVE

## 2021-02-27 LAB — PSA: PSA: 0.55 ng/mL (ref <=4.00)

## 2021-03-05 ENCOUNTER — Encounter: Payer: Self-pay | Admitting: Family Medicine

## 2021-03-05 ENCOUNTER — Ambulatory Visit (INDEPENDENT_AMBULATORY_CARE_PROVIDER_SITE_OTHER): Payer: Medicare Other | Admitting: Family Medicine

## 2021-03-05 ENCOUNTER — Other Ambulatory Visit: Payer: Self-pay

## 2021-03-05 VITALS — BP 125/55 | HR 66 | Ht 71.0 in | Wt 207.8 lb

## 2021-03-05 DIAGNOSIS — E7800 Pure hypercholesterolemia, unspecified: Secondary | ICD-10-CM

## 2021-03-05 DIAGNOSIS — Z0000 Encounter for general adult medical examination without abnormal findings: Secondary | ICD-10-CM | POA: Diagnosis not present

## 2021-03-05 DIAGNOSIS — Z23 Encounter for immunization: Secondary | ICD-10-CM

## 2021-03-05 DIAGNOSIS — M1909 Primary osteoarthritis, other specified site: Secondary | ICD-10-CM | POA: Diagnosis not present

## 2021-03-05 DIAGNOSIS — N401 Enlarged prostate with lower urinary tract symptoms: Secondary | ICD-10-CM | POA: Diagnosis not present

## 2021-03-05 DIAGNOSIS — R351 Nocturia: Secondary | ICD-10-CM

## 2021-03-05 HISTORY — DX: Pure hypercholesterolemia, unspecified: E78.00

## 2021-03-05 MED ORDER — SHINGRIX 50 MCG/0.5ML IM SUSR: INTRAMUSCULAR | 1 refills | Status: DC

## 2021-03-05 MED ORDER — ROSUVASTATIN CALCIUM 5 MG PO TABS: 5.0000 mg | ORAL_TABLET | Freq: Every day | ORAL | 3 refills | Status: DC

## 2021-03-05 NOTE — Patient Instructions (Addendum)
Thank you for coming to the office today.  You are at increased risk of future Cardiovascular complications such as Heart Attack or Stroke from an artery blockage due to abnormal cholesterol and/or risk factors. - As discussed, Statin Cholesterol pills both can both LOWER cholesterol and REDUCE this future risk of heart attack and stroke - Start Rosuvastatin (generic Crestor) 5mg  pill once at bedtime every night  If you develop mild aches or pains in muscle or joint that does NOT improve or go away after first 3-4 weeks then this may require [place] to adjust the dose. First I would recommend STOPPING the medication for a few weeks until your ache and pain symptoms completely RESOLVE. Then you can restart at a LOWER DOSE either HALF a pill at bedtime every night or LESS OFTEN such as one pill a week only and then gradually increase to every other day or max dose of 3 times a week  Lastly, sometimes we need to try other versions of this medicine to find one that works for you and does not cause side effects.   -------------  DUE for FASTING BLOOD WORK (no food or drink after midnight before the lab appointment, only water or coffee without cream/sugar on the morning of)  SCHEDULE "Lab Only" visit in the morning at the clinic for lab draw in 3 MONTHS   - Make sure Lab Only appointment is at about 1 week before your next appointment, so that results will be available  For Lab Results, once available within 2-3 days of blood draw, you can can log in to MyChart online to view your results and a brief explanation. Also, we can discuss results at next follow-up visit.    Please schedule a Follow-up Appointment to: Return in about 3 months (around 06/05/2021) for 3 month fasting lab then 1 week later Follow-up Cholesterol results.  If you have any other questions or concerns, please feel free to call the office or send a message through [place]. You may also schedule an earlier appointment if  necessary.  Additionally, you may be receiving a survey about your experience at our office within a few days to 1 week by e-mail or mail. We value your feedback.  [name], DO [place]

## 2021-03-05 NOTE — Progress Notes (Signed)
Subjective:    Patient ID: Jesse Collier, male    DOB: July 12, 1956, 65 y.o.   MRN: [id number]  Jesse Collier is a 65 y.o. male presenting on 03/05/2021 for Annual Exam   HPI   Here for Annual Physical and Lab Review.  Osteoarthritis multiple joints Neuropathy, sensory He has had orthopedic consultation, was not diagnosed w/ Rheumatoid arthritis. Also seen [facility] in [place]. He has seen Ortho in [facility] - Dr Berenice Primas 5 years ago R partial knee replacement.  He has daily arthritis joint pain episodic No significant debilitation but he is in pain daily He is able to function working part time  He is prescribed Celebrex 183m BID. He takes most often. He does swap for Advil 4039min future PRN, if he takes one he does not take the other. - Tried OTC Blue Emu, AuCubaream, Voltaren topical, Tylenol. - Locations include feet, knees, hands - In AM hands are stiff and have some stiffness with movement - He has worked as meAeronautical engineeror years, prolong standing, not as much lifting - If not work he is staying active with golf, yard work etSocial research officer, government- Admits mild tingling in bottoms of feet, very mild. Not painful.  Last visit 02/03/21,  For arthritis, he was prescribed and he took Gabapentin x 3 days, with 100100mose only for 3 days. He felt confusion and altered mental status was sent to hospital ED for evaluation. Discontinued Gabapentin and his symptoms resolved.  Today he is doing well no new concerns on arthritis.   ----------------------------  BPH Nocturia Chronic problem with some BPH symptoms mostly with nocturia waking up few times to void. Not impacting him much during day. Has failed tamsulosin or other alpha blocker made him sedated or groggy.   Fam history shingles - brother had it. He has had chicken pox. Not had shingles vaccine.  Health Maintenance:  PSA 0.55 (02/2021) negative. No fam history of prostate.  Colon CA screening - Previous Colonoscopy due to  emergency issue 2010, now 12 years later due for repeat, asymptomatic. He has cologuard kit we ordered last time, has not been ready to complete it yet.   [facility] 03/05[facility]2021 02/03/2021  Decreased Interest 0 0  Down, Depressed, Hopeless 0 0  PHQ - 2 Score 0 0  Altered sleeping 0 1  Tired, decreased energy 0 0  Change in appetite 0 0  Feeling bad or failure about yourself  0 0  Trouble concentrating 0 0  Moving slowly or fidgety/restless 0 0  Suicidal thoughts 0 0  PHQ-9 Score 0 1  Difficult doing work/chores Not difficult at all Not difficult at all    Past Medical History:  Diagnosis Date  . Arthritis   [facility]  Past Surgical History:  Procedure Laterality Date  . BOWEL RESECTION  2012   swallowed toothpick  . CARPAL TUNNEL RELEASE Left   . COLONOSCOPY  11/2013   ulcer of terminal ileum  . KNEE ARTHROSCOPY Right   . MEDIAL PARTIAL KNEE REPLACEMENT Right    Social History   Socioeconomic History  . Marital status: Married    Spouse name: Not on file  . Number of children: Not on file  . Years of education: Not on file  . Highest education level: Not on file  Occupational History  . Not on file  Tobacco Use  . Smoking status: Current Some Day Smoker    Packs/day: 0.25  . Smokeless tobacco:  Never Used  Substance and Sexual Activity  . Alcohol use: No    Alcohol/week: 0.0 standard drinks  . Drug use: No  . Sexual activity: Not on file  Other Topics Concern  . Not on file  Social History Narrative  . Not on file   Social Determinants of Health   Financial Resource Strain: Not on file  Food Insecurity: Not on file  Transportation Needs: Not on file  Physical Activity: Not on file  Stress: Not on file  Social Connections: Not on file  Intimate Partner Violence: Not on file   Family History  Problem Relation Age of Onset  . CAD Mother   . Colon cancer Neg Hx   . Prostate cancer Neg Hx    Current Outpatient Medications on  File Prior to Visit  Medication Sig  . celecoxib (CELEBREX) 100 MG capsule Take 100 mg by mouth in the morning and at bedtime.  Marland Kitchen ibuprofen (ADVIL) 200 MG tablet Take 1-2 tablets (200-400 mg total) by mouth 2 (two) times daily as needed.  . saw palmetto 80 MG capsule Take 1-2 capsules (80-160 mg total) by mouth 2 (two) times daily.   No current facility-administered medications on file prior to visit.    Review of Systems  Constitutional: Negative for activity change, appetite change, chills, diaphoresis, fatigue and fever.  HENT: Negative for congestion and hearing loss.   Eyes: Negative for visual disturbance.  Respiratory: Negative for cough, chest tightness, shortness of breath and wheezing.   Cardiovascular: Negative for chest pain, palpitations and leg swelling.  Gastrointestinal: Negative for abdominal pain, constipation, diarrhea, nausea and vomiting.  Genitourinary: Negative for dysuria, frequency and hematuria.  Musculoskeletal: Negative for arthralgias and neck pain.  Skin: Negative for rash.  Neurological: Negative for dizziness, weakness, light-headedness, numbness and headaches.  Hematological: Negative for adenopathy.  Psychiatric/Behavioral: Negative for behavioral problems, dysphoric mood and sleep disturbance.   Per HPI unless specifically indicated above     Objective:    BP (!) 125/55   Pulse 66   Ht _0  (1.803 m)   Wt 207 lb 12.8 oz (94.3 kg)   SpO2 99%   BMI 28.98 kg/m   Wt Readings from Last 3 Encounters:  03/05/21 207 lb 12.8 oz (94.3 kg)  02/03/21 204 lb 12.8 oz (92.9 kg)  12/08/16 189 lb (85.7 kg)    Physical Exam Vitals and nursing note reviewed.  Constitutional:      General: He is not in acute distress.    Appearance: He is well-developed. He is not diaphoretic.     Comments: Well-appearing, comfortable, cooperative  HENT:     Head: Normocephalic and atraumatic.  Eyes:     General:        Right eye: No discharge.        Left eye: No  discharge.     Conjunctiva/sclera: Conjunctivae normal.     Pupils: Pupils are equal, round, and reactive to light.  Neck:     Thyroid: No thyromegaly.  Cardiovascular:     Rate and Rhythm: Normal rate and regular rhythm.     Heart sounds: Normal heart sounds. No murmur heard.   Pulmonary:     Effort: Pulmonary effort is normal. No respiratory distress.     Breath sounds: Normal breath sounds. No wheezing or rales.  Abdominal:     General: Bowel sounds are normal. There is no distension.     Palpations: Abdomen is soft. There is no mass.  Tenderness: There is no abdominal tenderness.  Musculoskeletal:        General: No tenderness. Normal range of motion.     Cervical back: Normal range of motion and neck supple.     Comments: Upper / Lower Extremities: - Normal muscle tone, strength bilateral upper extremities 5/5, lower extremities 5/5  Lymphadenopathy:     Cervical: No cervical adenopathy.  Skin:    General: Skin is warm and dry.     Findings: No erythema or rash.  Neurological:     Mental Status: He is alert and oriented to person, place, and time.     Comments: Distal sensation intact to light touch all extremities  Psychiatric:        Behavior: Behavior normal.     Comments: Well groomed, good eye contact, normal speech and thoughts      I have personally reviewed the radiology report from 02/10/21 MRI Brain  MRI Brain W Wo Contrast  Anatomical Region Laterality Modality  Head -- Magnetic Resonance    Impression Performed by [facility] RAD No acute intracranial abnormalities.  Narrative Performed by [facility] This result has an attachment that is not available.  EXAM: Magnetic resonance imaging, brain, without and with contrast material.  DATE: 02/10/2021 6:26 PM  ACCESSION: [id number] UN  DICTATED: 02/10/2021 6:30 PM  INTERPRETATION LOCATION: [place]   CLINICAL INDICATION: 65 years old Male with transient episode of confusion ; Transient ischemic attack  (TIA)    COMPARISON: None   TECHNIQUE: Multiplanar, multisequence MR imaging of the brain was performed without and with I.V. contrast.   FINDINGS:   There are scattered foci of signal abnormality within the periventricular and deep white matter. These are nonspecific but commonly seen with small vessel ischemic changes. There Ventricles are normal in size. There is no midline shift. No extra-axial fluid collection. No evidence of intracranial hemorrhage. No diffusion weighted signal abnormality to suggest acute infarct.   No mass. There is no abnormal enhancement.   There is heterogeneous opacification of the left frontal sinus. Scattered opacification of the bilateral posterior ethmoids. Mild circumferential mucosal thickening in the left maxillary sinus.  Procedure Note  Chilton Si, MD - 02/10/2021  Formatting of this note might be different from the original.  EXAM: Magnetic resonance imaging, brain, without and with contrast material.  DATE: 02/10/2021 6:26 PM  ACCESSION: [id number] UN  DICTATED: 02/10/2021 6:30 PM  INTERPRETATION LOCATION: [place]: 65 years old Male with transient episode of confusion ; Transient ischemic attack (TIA)    COMPARISON: None   TECHNIQUE: Multiplanar, multisequence MR imaging of the brain was performed without and with I.V. contrast.   FINDINGS:   There are scattered foci of signal abnormality within the periventricular and deep white matter. These are nonspecific but commonly seen with small vessel ischemic changes. There Ventricles are normalin size. There is no midline shift. No extra-axial fluid collection. No evidence of intracranial hemorrhage. No diffusion weighted signal abnormality to suggest acute infarct.   No mass. There is no abnormal enhancement.   There is heterogeneous opacification of the left frontal sinus. Scattered opacification of the bilateral posterior ethmoids. Mild  circumferential mucosal thickening in the left maxillary sinus.   IMPRESSION:  No acute intracranial abnormalities. Exam End: 02/10/21 6:26 PM   Specimen Collected: 02/10/21 6:30 PM Last Resulted: 02/10/21 6:35 PM  Received From: [facility]  Result Received: 02/26/21 7:37 AM     Results for orders placed or performed in  visit on 02/26/21  HIV Antibody (routine testing w rflx)  Result Value Ref Range   HIV 1&2 Ab, 4th Generation NON-REACTIVE NON-REACTIVE  Hepatitis C antibody  Result Value Ref Range   Hepatitis C Ab NON-REACTIVE NON-REACTIVE   SIGNAL TO CUT-OFF 0.03 <1.00  PSA  Result Value Ref Range   PSA 0.55 < OR = 4.00 ng/mL  Lipid panel  Result Value Ref Range   Cholesterol 195 <200 mg/dL   HDL 55 > OR = 40 mg/dL   Triglycerides 162 (H) <150 mg/dL   LDL Cholesterol (Calc) 112 (H) mg/dL (calc)   Total CHOL/HDL Ratio 3.5 <5.0 (calc)   Non-HDL Cholesterol (Calc) 140 (H) <130 mg/dL (calc)  COMPLETE METABOLIC PANEL WITH GFR  Result Value Ref Range   Glucose, Bld 80 65 - 99 mg/dL   BUN 21 7 - 25 mg/dL   Creat 1.07 0.70 - 1.25 mg/dL   GFR, Est Non African American 72 > OR = 60 mL/min/1.43m   GFR, Est African American 84 > OR = 60 mL/min/1.723m  BUN/Creatinine Ratio NOT APPLICABLE 6 - 22 (calc)   Sodium 140 135 - 146 mmol/L   Potassium 4.3 3.5 - 5.3 mmol/L   Chloride 106 98 - 110 mmol/L   CO2 25 20 - 32 mmol/L   Calcium 9.2 8.6 - 10.3 mg/dL   Total Protein 6.8 6.1 - 8.1 g/dL   Albumin 4.5 3.6 - 5.1 g/dL   Globulin 2.3 1.9 - 3.7 g/dL (calc)   AG Ratio 2.0 1.0 - 2.5 (calc)   Total Bilirubin 0.6 0.2 - 1.2 mg/dL   Alkaline phosphatase (APISO) 77 35 - 144 U/L   AST 17 10 - 35 U/L   ALT 11 9 - 46 U/L  CBC with Differential/Platelet  Result Value Ref Range   WBC 5.2 3.8 - 10.8 Thousand/uL   RBC 4.71 4.20 - 5.80 Million/uL   Hemoglobin 14.5 13.2 - 17.1 g/dL   HCT 45.7 38.5 - 50.0 %   MCV 97.0 80.0 - 100.0 fL   MCH 30.8 27.0 - 33.0 pg   MCHC 31.7 (L) 32.0 -  36.0 g/dL   RDW 12.0 11.0 - 15.0 %   Platelets 132 (L) 140 - 400 Thousand/uL   MPV 11.4 7.5 - 12.5 fL   Neutro Abs 3,172 1,500 - 7,800 cells/uL   Lymphs Abs 1,596 850 - 3,900 cells/uL   Absolute Monocytes 400 200 - 950 cells/uL   Eosinophils Absolute 0 (L) 15 - 500 cells/uL   Basophils Absolute 31 0 - 200 cells/uL   Neutrophils Relative % 61 %   Total Lymphocyte 30.7 %   Monocytes Relative 7.7 %   Eosinophils Relative 0.0 %   Basophils Relative 0.6 %  Hemoglobin A1c  Result Value Ref Range   Hgb A1c MFr Bld 5.5 <5.7 % of total Hgb   Mean Plasma Glucose 111 mg/dL   eAG (mmol/L) 6.2 mmol/L      Assessment & Plan:   Problem List Items Addressed This Visit    Pure hypercholesterolemia   Relevant Medications   rosuvastatin (CRESTOR) 5 MG tablet   DJD (degenerative joint disease)   BPH associated with nocturia    Other Visit Diagnoses    Annual physical exam    -  Primary   Need for shingles vaccine       Relevant Medications   SHINGRIX injection      Updated Health Maintenance information Cologuard ordered he has kit, will submit when  ready Printed order for rx Shingrix at pharmacy PSA negative Reviewed recent lab results with patient Encouraged improvement to lifestyle with diet and exercise Goal of weight loss  BPH nocturia Mild symptoms, seems to not have daytime symptoms or other concerns. Reassurance, he did not try the saw palmetto. Follow up if worsening  Hyperlipidemia Mild elevated LDL The 10-year ASCVD risk score Mikey Bussing DC Jr., et al., 2013) is: 15.7% Start Statin Rosuvastatin 90m nightly, ASCVD risk reduction. Counsel on benefit side effects Check lipid CMET in 3 months  OA/DJD Off Gabapentin due to side effect intolerance, had ED visit caused confusion Continue on conservative care with tylenol, current celebrex   Meds ordered this encounter  Medications  . rosuvastatin (CRESTOR) 5 MG tablet    Sig: Take 1 tablet (5 mg total) by mouth at bedtime.     Dispense:  90 tablet    Refill:  3  . SHINGRIX injection    Sig: Inject 0.5 mL into muscle for shingles vaccine. Repeat dose in 2-6 months.    Dispense:  0.5 mL    Refill:  1      Follow up plan: Return in about 3 months (around 06/05/2021) for 3 month fasting lab then 1 week later Follow-up Cholesterol results.  ANobie Putnam DO [facility] 03/05/2021, 2:08 PM

## 2021-03-06 ENCOUNTER — Other Ambulatory Visit: Payer: Self-pay | Admitting: Family Medicine

## 2021-03-06 DIAGNOSIS — E7800 Pure hypercholesterolemia, unspecified: Secondary | ICD-10-CM

## 2021-04-08 ENCOUNTER — Ambulatory Visit
Admission: RE | Admit: 2021-04-08 | Discharge: 2021-04-08 | Disposition: A | Discharge status: Home / Self Care | Payer: Medicare Other | Attending: Family Medicine | Admitting: Family Medicine

## 2021-04-08 ENCOUNTER — Ambulatory Visit (INDEPENDENT_AMBULATORY_CARE_PROVIDER_SITE_OTHER): Payer: Medicare Other | Admitting: Family Medicine

## 2021-04-08 ENCOUNTER — Other Ambulatory Visit: Payer: Self-pay

## 2021-04-08 ENCOUNTER — Encounter: Payer: Self-pay | Admitting: Family Medicine

## 2021-04-08 ENCOUNTER — Ambulatory Visit
Admission: RE | Admit: 2021-04-08 | Discharge: 2021-04-08 | Disposition: A | Discharge status: Home / Self Care | Payer: Medicare Other | Source: Ambulatory Visit | Attending: Family Medicine | Admitting: Family Medicine

## 2021-04-08 VITALS — BP 130/51 | HR 61 | Ht 71.0 in | Wt 211.2 lb

## 2021-04-08 DIAGNOSIS — J9801 Acute bronchospasm: Secondary | ICD-10-CM | POA: Insufficient documentation

## 2021-04-08 DIAGNOSIS — J441 Chronic obstructive pulmonary disease with (acute) exacerbation: Secondary | ICD-10-CM | POA: Diagnosis not present

## 2021-04-08 DIAGNOSIS — R202 Paresthesia of skin: Secondary | ICD-10-CM | POA: Diagnosis not present

## 2021-04-08 DIAGNOSIS — R059 Cough, unspecified: Secondary | ICD-10-CM | POA: Diagnosis not present

## 2021-04-08 MED ORDER — BENZONATATE 100 MG PO CAPS
100.0000 mg | ORAL_CAPSULE | Freq: Three times a day (TID) | ORAL | 0 refills | Status: DC | PRN
Start: 2021-04-08 — End: 2021-11-13

## 2021-04-08 MED ORDER — LEVOFLOXACIN 500 MG PO TABS: 500.0000 mg | ORAL_TABLET | Freq: Every day | ORAL | 0 refills | Status: DC

## 2021-04-08 MED ORDER — ALBUTEROL SULFATE HFA 108 (90 BASE) MCG/ACT IN AERS: 2.0000 | INHALATION_SPRAY | RESPIRATORY_TRACT | 2 refills | Status: DC | PRN

## 2021-04-08 MED ORDER — PREDNISONE 20 MG PO TABS: ORAL_TABLET | ORAL | 0 refills | Status: DC

## 2021-04-08 NOTE — Progress Notes (Signed)
Subjective:    Patient ID: Jesse Collier, male    DOB: 01-Jul-1956, 65 y.o.   MRN: [id number]  Jesse Collier is a 65 y.o. male presenting on 04/08/2021 for Shortness of Breath and Cough   HPI  Presumed COPD / Emphysema Exacerbation Bronchitis / Chest Congestion Reports onset symptoms 6 weeks ago with gradual worsening with cough now, not always productive, worse at night if lay down. Not having lower ext swelling or chest pain or other symptoms. Denies significant sinus pressure symptoms He has tried OTC Mucinex, Cough drop, Sudafed - without relief, Claritin, Benadryl Tried home COVID test negative x 5 days ago. Admits cough with dyspnea, easier winded on exertion Former smoker 1-1.5ppd 30-40 years, cut back to 6 months now on few cig only Denies any fevers chills, body aches   Depression screen [facility] 2/9 04/08/2021 03/05/2021 02/03/2021  Decreased Interest 0 0 0  Down, Depressed, Hopeless 0 0 0  PHQ - 2 Score 0 0 0  Altered sleeping 0 0 1  Tired, decreased energy 0 0 0  Change in appetite 0 0 0  Feeling bad or failure about yourself  0 0 0  Trouble concentrating 0 0 0  Moving slowly or fidgety/restless 0 0 0  Suicidal thoughts 0 0 0  PHQ-9 Score 0 0 1  Difficult doing work/chores Not difficult at all Not difficult at all Not difficult at all    Social History   Tobacco Use   Smoking status: Some Days    Packs/day: 0.25    Pack years: 0.00    Types: Cigarettes   Smokeless tobacco: Never   Tobacco comments:    1 to 1.5 ppd 30-40 years.  Substance Use Topics   Alcohol use: No    Alcohol/week: 0.0 standard drinks   Drug use: No    Review of Systems Per HPI unless specifically indicated above     Objective:    BP (!) 130/51   Pulse 61   Ht 5\' 11"  (1.803 m)   Wt 211 lb 3.2 oz (95.8 kg)   SpO2 99%   BMI 29.46 kg/m   Wt Readings from Last 3 Encounters:  04/08/21 211 lb 3.2 oz (95.8 kg)  03/05/21 207 lb 12.8 oz (94.3 kg)  02/03/21 204 lb 12.8 oz (92.9 kg)     Physical Exam Vitals and nursing note reviewed.  Constitutional:      General: He is not in acute distress.    Appearance: He is well-developed. He is not diaphoretic.     Comments: Well-appearing, comfortable, cooperative  HENT:     Head: Normocephalic and atraumatic.  Eyes:     General:        Right eye: No discharge.        Left eye: No discharge.     Conjunctiva/sclera: Conjunctivae normal.  Neck:     Thyroid: No thyromegaly.  Cardiovascular:     Rate and Rhythm: Normal rate and regular rhythm.     Pulses: Normal pulses.     Heart sounds: Normal heart sounds. No murmur heard. Pulmonary:     Effort: Pulmonary effort is normal. No respiratory distress.     Breath sounds: Examination of the right-upper field reveals wheezing. Examination of the left-upper field reveals wheezing. Examination of the right-lower field reveals decreased breath sounds. Examination of the left-lower field reveals decreased breath sounds. Decreased breath sounds and wheezing present. No rales.  Musculoskeletal:        General: Normal range of  motion.     Cervical back: Normal range of motion and neck supple.  Lymphadenopathy:     Cervical: No cervical adenopathy.  Skin:    General: Skin is warm and dry.     Findings: No erythema or rash.  Neurological:     Mental Status: He is alert and oriented to person, place, and time. Mental status is at baseline.  Psychiatric:        Behavior: Behavior normal.     Comments: Well groomed, good eye contact, normal speech and thoughts     Results for orders placed or performed in visit on 02/26/21  HIV Antibody (routine testing w rflx)  Result Value Ref Range   HIV 1&2 Ab, 4th Generation NON-REACTIVE NON-REACTIVE  Hepatitis C antibody  Result Value Ref Range   Hepatitis C Ab NON-REACTIVE NON-REACTIVE   SIGNAL TO CUT-OFF 0.03 <1.00  PSA  Result Value Ref Range   PSA 0.55 < OR = 4.00 ng/mL  Lipid panel  Result Value Ref Range   Cholesterol 195 <200  mg/dL   HDL 55 > OR = 40 mg/dL   Triglycerides 162 (H) <150 mg/dL   LDL Cholesterol (Calc) 112 (H) mg/dL (calc)   Total CHOL/HDL Ratio 3.5 <5.0 (calc)   Non-HDL Cholesterol (Calc) 140 (H) <130 mg/dL (calc)  COMPLETE METABOLIC PANEL WITH GFR  Result Value Ref Range   Glucose, Bld 80 65 - 99 mg/dL   BUN 21 7 - 25 mg/dL   Creat 1.07 0.70 - 1.25 mg/dL   GFR, Est Non African American 72 > OR = 60 mL/min/1.66m2   GFR, Est African American 84 > OR = 60 mL/min/1.68m2   BUN/Creatinine Ratio NOT APPLICABLE 6 - 22 (calc)   Sodium 140 135 - 146 mmol/L   Potassium 4.3 3.5 - 5.3 mmol/L   Chloride 106 98 - 110 mmol/L   CO2 25 20 - 32 mmol/L   Calcium 9.2 8.6 - 10.3 mg/dL   Total Protein 6.8 6.1 - 8.1 g/dL   Albumin 4.5 3.6 - 5.1 g/dL   Globulin 2.3 1.9 - 3.7 g/dL (calc)   AG Ratio 2.0 1.0 - 2.5 (calc)   Total Bilirubin 0.6 0.2 - 1.2 mg/dL   Alkaline phosphatase (APISO) 77 35 - 144 U/L   AST 17 10 - 35 U/L   ALT 11 9 - 46 U/L  CBC with Differential/Platelet  Result Value Ref Range   WBC 5.2 3.8 - 10.8 Thousand/uL   RBC 4.71 4.20 - 5.80 Million/uL   Hemoglobin 14.5 13.2 - 17.1 g/dL   HCT 45.7 38.5 - 50.0 %   MCV 97.0 80.0 - 100.0 fL   MCH 30.8 27.0 - 33.0 pg   MCHC 31.7 (L) 32.0 - 36.0 g/dL   RDW 12.0 11.0 - 15.0 %   Platelets 132 (L) 140 - 400 Thousand/uL   MPV 11.4 7.5 - 12.5 fL   Neutro Abs 3,172 1,500 - 7,800 cells/uL   Lymphs Abs 1,596 850 - 3,900 cells/uL   Absolute Monocytes 400 200 - 950 cells/uL   Eosinophils Absolute 0 (L) 15 - 500 cells/uL   Basophils Absolute 31 0 - 200 cells/uL   Neutrophils Relative % 61 %   Total Lymphocyte 30.7 %   Monocytes Relative 7.7 %   Eosinophils Relative 0.0 %   Basophils Relative 0.6 %  Hemoglobin A1c  Result Value Ref Range   Hgb A1c MFr Bld 5.5 <5.7 % of total Hgb   Mean Plasma Glucose 111  mg/dL   eAG (mmol/L) 6.2 mmol/L      Assessment & Plan:   Problem List Items Addressed This Visit     Tingling of both feet   Relevant Orders    Ambulatory referral to Podiatry   Other Visit Diagnoses     COPD exacerbation ([place])    -  Primary   Relevant Medications   levofloxacin (LEVAQUIN) 500 MG tablet   predniSONE (DELTASONE) 20 MG tablet   albuterol (VENTOLIN HFA) 108 (90 Base) MCG/ACT inhaler   benzonatate (TESSALON) 100 MG capsule   Other Relevant Orders   DG Chest 2 View   Acute bronchospasm       Relevant Medications   albuterol (VENTOLIN HFA) 108 (90 Base) MCG/ACT inhaler   benzonatate (TESSALON) 100 MG capsule   Other Relevant Orders   DG Chest 2 View       Acute COPD  / Bronchospasm >6 weeks now of symptoms with sinus vs now worse bronchitis into bronchospasm could be COPD flare, no clear diagnosis of COPD/Emphysema however clinical diagnosis based on history of tobacco abuse smoker 1-1.5 ppd 30-40 years in past, has reduced amount and cut back now >6 months overall. But still likely underlying emphysema. - No prior imaging available that demonstrates concern. Last about 10 yrs ago  Vaccinated against covid and had home test negative  Start taking Levaquin antibiotic 500mg  daily x 7 days, counseling on risk of muscle tendon injury STart Prednisone taper 7 days Start Tessalon Perls take 1 capsule up to 3 times a day as needed for cough Albuterol PRN inhaler for bronchospasm cough Limited OTC cough cold medicine at this point if not helping CXR Today follow up results.  #Podiatry Referral to podiatry due to bilateral foot tingling abnormal sensation. Not having pain or other clear etiology, discussed options and he would like other opinion from [place] This Encounter  Procedures   DG Chest 2 View    Standing Status:   Future    Number of Occurrences:   1    Standing Expiration Date:   04/08/2022    Order Specific Question:   Reason for Exam (SYMPTOM  OR DIAGNOSIS REQUIRED)    Answer:   cough 6 weeks, former smoker for 30+ years, possible COPD    Order Specific Question:   Preferred  imaging location?    Answer:   [facility]   Ambulatory referral to Podiatry    Referral Priority:   Routine    Referral Type:   Consultation    Referral Reason:   Specialty Services Required    Requested Specialty:   Podiatry    Number of Visits Requested:   1     Meds ordered this encounter  Medications   levofloxacin (LEVAQUIN) 500 MG tablet    Sig: Take 1 tablet (500 mg total) by mouth daily. For 7 days    Dispense:  7 tablet    Refill:  0   predniSONE (DELTASONE) 20 MG tablet    Sig: Take daily with food. Start with 60mg  (3 pills) x 2 days, then reduce to 40mg  (2 pills) x 2 days, then 20mg  (1 pill) x 3 days    Dispense:  13 tablet    Refill:  0   albuterol (VENTOLIN HFA) 108 (90 Base) MCG/ACT inhaler    Sig: Inhale 2 puffs into the lungs every 4 (four) hours as needed for wheezing or shortness of breath (cough).    Dispense:  1 each  Refill:  2   benzonatate (TESSALON) 100 MG capsule    Sig: Take 1 capsule (100 mg total) by mouth 3 (three) times daily as needed for cough.    Dispense:  30 capsule    Refill:  0      Follow up plan: Return if symptoms worsen or fail to improve.   [name], [facility] 04/08/2021, 2:45 PM

## 2021-04-08 NOTE — Patient Instructions (Addendum)
Thank you for coming to the office today.  X-ray today  Start Levaquin Start Prednisone Start Tessalon Perls take 1 capsule up to 3 times a day as needed for cough Albuterol as needed for cough / wheezing  Future may need CT imaging or pulmonology   [facility] [place], [place] [address] Hours - M-F 8-5 Phone: [phone] phone   Please schedule a Follow-up Appointment to: Return if symptoms worsen or fail to improve.  If you have any other questions or concerns, please feel free to call the office or send a message through [facility]. You may also schedule an earlier appointment if necessary.  Additionally, you may be receiving a survey about your experience at our office within a few days to 1 week by e-mail or mail. We value your feedback.  [name], DO [place]

## 2021-04-24 DIAGNOSIS — G5791 Unspecified mononeuropathy of right lower limb: Secondary | ICD-10-CM | POA: Diagnosis not present

## 2021-04-24 DIAGNOSIS — B351 Tinea unguium: Secondary | ICD-10-CM | POA: Diagnosis not present

## 2021-04-24 DIAGNOSIS — G5761 Lesion of plantar nerve, right lower limb: Secondary | ICD-10-CM | POA: Diagnosis not present

## 2021-04-24 DIAGNOSIS — L600 Ingrowing nail: Secondary | ICD-10-CM | POA: Diagnosis not present

## 2021-04-24 DIAGNOSIS — M79675 Pain in left toe(s): Secondary | ICD-10-CM | POA: Diagnosis not present

## 2021-04-24 DIAGNOSIS — M79674 Pain in right toe(s): Secondary | ICD-10-CM | POA: Diagnosis not present

## 2021-05-15 DIAGNOSIS — G5791 Unspecified mononeuropathy of right lower limb: Secondary | ICD-10-CM | POA: Diagnosis not present

## 2021-05-15 DIAGNOSIS — G5761 Lesion of plantar nerve, right lower limb: Secondary | ICD-10-CM | POA: Diagnosis not present

## 2021-06-30 DIAGNOSIS — R200 Anesthesia of skin: Secondary | ICD-10-CM | POA: Diagnosis not present

## 2021-08-11 ENCOUNTER — Telehealth: Payer: Self-pay | Admitting: Family Medicine

## 2021-08-11 DIAGNOSIS — G5791 Unspecified mononeuropathy of right lower limb: Secondary | ICD-10-CM

## 2021-08-11 DIAGNOSIS — G608 Other hereditary and idiopathic neuropathies: Secondary | ICD-10-CM

## 2021-08-11 NOTE — Telephone Encounter (Signed)
Pt is calling to ask if Dr. Raliegh Ip can write a letter for his insurance company that the pt has neuropathy so that the patient can get his shoes paid for [facility] Medicare. Pt was referred Podiatry, podiatry referred him to another Dr. Sadie Haber Medicare is requesting letter from the PCP stating the dx. So that shoes can be covered. Please advise CB- [phone]

## 2021-08-11 NOTE — Telephone Encounter (Signed)
Please notify patient that letter was written and sent to Jesse Collier today.  If he needs [place] to fax it - please tell us exactly where to send it and we can print and sign it. Otherwise if he is able to submit the letter on his own, then it is on his mychart he can print and send it.  Also, if the insurance requires more information - he will need to help provide [place] more details on what exactly type of shoes he needs and what they say - often insurance can send [place] paperwork to complete if this is not sufficient.  [name], DO [facility] 08/11/2021, 5:21 PM

## 2021-08-28 DIAGNOSIS — G629 Polyneuropathy, unspecified: Secondary | ICD-10-CM | POA: Diagnosis not present

## 2021-08-28 DIAGNOSIS — M79672 Pain in left foot: Secondary | ICD-10-CM | POA: Diagnosis not present

## 2021-08-28 DIAGNOSIS — M79671 Pain in right foot: Secondary | ICD-10-CM | POA: Diagnosis not present

## 2021-08-28 DIAGNOSIS — R200 Anesthesia of skin: Secondary | ICD-10-CM

## 2021-08-28 HISTORY — DX: Anesthesia of skin: R20.0

## 2021-08-28 HISTORY — DX: Polyneuropathy, unspecified: G62.9

## 2021-10-15 DIAGNOSIS — M545 Low back pain, unspecified: Secondary | ICD-10-CM | POA: Diagnosis not present

## 2021-10-15 DIAGNOSIS — R2689 Other abnormalities of gait and mobility: Secondary | ICD-10-CM | POA: Diagnosis not present

## 2021-10-15 DIAGNOSIS — R200 Anesthesia of skin: Secondary | ICD-10-CM | POA: Diagnosis not present

## 2021-10-15 DIAGNOSIS — G629 Polyneuropathy, unspecified: Secondary | ICD-10-CM | POA: Diagnosis not present

## 2021-10-15 DIAGNOSIS — M79672 Pain in left foot: Secondary | ICD-10-CM | POA: Diagnosis not present

## 2021-10-15 DIAGNOSIS — M79671 Pain in right foot: Secondary | ICD-10-CM | POA: Diagnosis not present

## 2021-11-12 ENCOUNTER — Ambulatory Visit: Payer: Self-pay | Admitting: *Deleted

## 2021-11-12 NOTE — Telephone Encounter (Signed)
Second attempt to reach patient- left message to call office °

## 2021-11-12 NOTE — Telephone Encounter (Signed)
° ° °  Chief Complaint: Cut right middle finger Symptoms: pain, redness Frequency: Happened 1 week ago Pertinent Negatives: Patient denies fever Disposition: [] ED /[x] Urgent Care (no appt availability in office) / [] Appointment(In office/virtual)/ []  [facility]/ [] Home Care/ [] Refused Recommended Disposition /[] [facility]/ []  Follow-up with PCP Additional Notes: Requests appointment "in case I don't go to UC."  Reason for Disposition  [1] After 3 days AND [2] pain not improved  Answer Assessment - Initial Assessment Questions 1. MECHANISM: "How did the injury happen?"      Cut with knife 2. ONSET: "When did the injury happen?" (Minutes or hours ago)      1 week ago 3. LOCATION: "What part of the finger is injured?" "Is the nail damaged?"      Middle finger on top 4. APPEARANCE of the INJURY: "What does the injury look like?"      Red,some swelling 5. SEVERITY: "Can you use the hand normally?"  "Can you bend your fingers into a ball and then fully open them?"     Yes 6. SIZE: For cuts, bruises, or swelling, ask: "How large is it?" (e.g., inches or centimeters;  entire finger)      1 cm 7. PAIN: "Is there pain?" If Yes, ask: "How bad is the pain?"    (e.g., Scale 1-10; or mild, moderate, severe)  - NONE (0): no pain.  - MILD (1-3): doesn't interfere with normal activities.   - MODERATE (4-7): interferes with normal activities or awakens from sleep.  - SEVERE (8-10): excruciating pain, unable to hold a glass of water or bend finger even a little.     10 8. TETANUS: For any breaks in the skin, ask: "When was the last tetanus booster?"     Unsure 9. OTHER SYMPTOMS: "Do you have any other symptoms?"     No 10. PREGNANCY: "Is there any chance you are pregnant?" "When was your last menstrual period?"       N/A  Protocols used: Finger Injury-A-AH

## 2021-11-12 NOTE — Telephone Encounter (Signed)
Summary: cut middle finger right hand/currently swollen,throbbing red and very painful possibility infected.   Pt stated he accidentally cut himself about a week ago on middle finger right hand. Pt stated finger is currently swollen,throbbing red and very painful possibility infected.   No appointments for today.   Seeking clinical advice.       3rd attempt to contact patient regarding cut to finger. No answer, LVMTCB [phone]. Recommended patient go to UC if unable to call back .

## 2021-11-12 NOTE — Telephone Encounter (Signed)
Summary: cut middle finger right hand/currently swollen,throbbing red and very painful possibility infected.   Pt stated he accidentally cut himself about a week ago on middle finger right hand. Pt stated finger is currently swollen,throbbing red and very painful possibility infected.   No appointments for today.   Seeking clinical advice.     Attempted to call patient- left message to call office

## 2021-11-13 ENCOUNTER — Ambulatory Visit
Admission: RE | Admit: 2021-11-13 | Discharge: 2021-11-13 | Disposition: A | Discharge status: Home / Self Care | Payer: Medicare Other | Source: Ambulatory Visit

## 2021-11-13 ENCOUNTER — Other Ambulatory Visit: Payer: Self-pay

## 2021-11-13 VITALS — BP 143/62 | HR 61 | Temp 98.9°F | Resp 18 | Ht 71.0 in | Wt 204.0 lb

## 2021-11-13 DIAGNOSIS — M7989 Other specified soft tissue disorders: Secondary | ICD-10-CM | POA: Diagnosis not present

## 2021-11-13 DIAGNOSIS — S61212A Laceration without foreign body of right middle finger without damage to nail, initial encounter: Secondary | ICD-10-CM

## 2021-11-13 DIAGNOSIS — L089 Local infection of the skin and subcutaneous tissue, unspecified: Secondary | ICD-10-CM | POA: Diagnosis not present

## 2021-11-13 MED ORDER — AMOXICILLIN-POT CLAVULANATE 875-125 MG PO TABS: 1.0000 | ORAL_TABLET | Freq: Two times a day (BID) | ORAL | 0 refills | Status: AC

## 2021-11-13 MED ORDER — TETANUS-DIPHTHERIA TOXOIDS TD 5-2 LFU IM INJ: 0.5000 mL | INJECTION | Freq: Once | INTRAMUSCULAR | Status: DC

## 2021-11-13 NOTE — ED Triage Notes (Signed)
Pt here with C/O right middle finger injury, was cutting chicken an slipped an stabbed finger with knife 5 days ago.

## 2021-11-13 NOTE — ED Triage Notes (Signed)
Pt had Tdap 12/23/2019

## 2021-11-13 NOTE — ED Provider Notes (Signed)
[facility]    CSN: [id number] Arrival date & time: 11/13/21  0804      History   Chief Complaint Chief Complaint  Patient presents with   Finger Injury    HPI Jesse Collier is a 66 y.o. male.   66 year old male presents with injury to his right middle finger. 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 He cleaned the area with soap and water and hydrogen peroxide and applied a bandaid but continued to work with chicken and meat. Area was improving some but yesterday his finger became more red, swollen and painful. His wife had Amoxicillin left over from a previous illness and he took one dose last night. Today he indicates the finger feels slightly better but still very red and swollen. Able to bend finger and no numbness. According to immunization records, his last Tdap was in 2021. Other chronic health issues include peripheral neuropathy. Currently takes low dose of Gabapentin daily (higher doses cause a reaction).   The history is provided by the patient.   Past Medical History:  Diagnosis Date   Arthritis    Prostate cancer [facility])     Patient Active Problem List   Diagnosis Date Noted   Pure hypercholesterolemia 03/05/2021   DJD (degenerative joint disease) 02/03/2021   BPH associated with nocturia 02/03/2021   Tingling of both feet 02/03/2021   Compulsive tobacco user syndrome 09/10/2015    Past Surgical History:  Procedure Laterality Date   BOWEL RESECTION  2012   swallowed toothpick   CARPAL TUNNEL RELEASE Left    COLONOSCOPY  11/2013   ulcer of terminal ileum   KNEE ARTHROSCOPY Right    MEDIAL PARTIAL KNEE REPLACEMENT Right        Home Medications    Prior to Admission medications   Medication Sig Start Date End Date Taking? Authorizing Provider  amoxicillin-clavulanate (AUGMENTIN) 875-125 MG tablet Take 1 tablet by mouth  every 12 (twelve) hours for 7 days. 11/13/21 11/20/21 Yes Eleazar Kimmey, Nicholes Stairs, NP  gabapentin (NEURONTIN) 100 MG capsule Take 1 capsule by mouth 3 (three) times daily. 08/28/21  Yes [provider]  [facility] injection Inject 0.5 mL into muscle for shingles vaccine. Repeat dose in 2-6 months. 03/05/21   Olin Hauser, DO    Family History Family History  Problem Relation Age of Onset   CAD Mother    Colon cancer Neg Hx    Prostate cancer Neg Hx     Social History Social History   Tobacco Use   Smoking status: Some Days    Packs/day: 0.25    Types: Cigarettes   Smokeless tobacco: Never   Tobacco comments:    1 to 1.5 ppd 30-40 years.  Substance Use Topics   Alcohol use: No    Alcohol/week: 0.0 standard drinks   Drug use: No     Allergies   Gabapentin   Review of Systems Review of Systems  Constitutional:  Negative for activity change, appetite change, chills, diaphoresis, fatigue and fever.  HENT:  Negative for sore throat and trouble swallowing.   Respiratory:  Negative for chest tightness and shortness of breath.   Musculoskeletal:  Positive for arthralgias. Negative for myalgias.  Skin:  Positive for color change and wound.  Allergic/Immunologic: Negative for environmental allergies, food allergies and immunocompromised state.  Neurological:  Negative for  dizziness, tremors, seizures, syncope, speech difficulty, weakness, light-headedness and headaches.  Hematological:  Negative for adenopathy. Does not bruise/bleed easily.    Physical Exam Triage Vital Signs ED Triage Vitals  Enc Vitals Group     BP 11/13/21 0820 (!) 143/62     Pulse Rate 11/13/21 0820 61     Resp 11/13/21 0820 18     Temp 11/13/21 0820 98.9 F (37.2 C)     Temp Source 11/13/21 0820 Oral     SpO2 11/13/21 0820 97 %     Weight 11/13/21 0818 204 lb (92.5 kg)     Height 11/13/21 0818 5\' 11"  (1.803 m)     Head Circumference --      Peak Flow --      Pain Score 11/13/21 0818 0      Pain Loc --      Pain Edu? --      Excl. in [place]? --    No data found.  Updated Vital Signs BP (!) 143/62 (BP Location: Right Arm)    Pulse 61    Temp 98.9 F (37.2 C) (Oral)    Resp 18    Ht 5\' 11"  (1.803 m)    Wt 204 lb (92.5 kg)    SpO2 97%    BMI 28.45 kg/m   Visual Acuity Right Eye Distance:   Left Eye Distance:   Bilateral Distance:    Right Eye Near:   Left Eye Near:    Bilateral Near:     Physical Exam Vitals and nursing note reviewed.  Constitutional:      General: He is awake. He is not in acute distress.    Appearance: He is well-developed and well-groomed.     Comments: He is sitting comfortably on the exam table in no acute distress.   HENT:     Head: Normocephalic and atraumatic.     Right Ear: Hearing normal.     Left Ear: Hearing normal.  Eyes:     Extraocular Movements: Extraocular movements intact.     Conjunctiva/sclera: Conjunctivae normal.  Cardiovascular:     Rate and Rhythm: Normal rate.  Pulmonary:     Effort: Pulmonary effort is normal.  Musculoskeletal:        General: Swelling and tenderness present. Normal range of motion.     Right hand: Swelling and tenderness present. Normal range of motion. Normal strength. Normal sensation. Normal capillary refill. Normal pulse.     Left hand: Normal.       Hands:     Comments: Small 54mm puncture wound/laceration present on right middle finger just distal to DIP. Swelling and redness present from DIP to tip of finger especially on lateral edge. No discharge present. Has full range of motion of fingers. No numbness. Good capillary refill and pulses.   Skin:    General: Skin is warm and dry.     Capillary Refill: Capillary refill takes less than 2 seconds.     Findings: Erythema and wound present. No abscess, bruising, ecchymosis or rash.  Neurological:     General: No focal deficit present.     Mental Status: He is alert and oriented to person, place, and time.     Sensory: Sensation is intact. No  sensory deficit.     Motor: Motor function is intact.  Psychiatric:        Mood and Affect: Mood normal.        Behavior: Behavior normal. Behavior is cooperative.  Thought Content: Thought content normal.        Judgment: Judgment normal.     UC Treatments / Results  Labs (all labs ordered are listed, but only abnormal results are displayed) Labs Reviewed - No data to display  EKG   Radiology No results found.  Procedures Procedures (including critical care time)  Medications Ordered in UC Medications - No data to display  Initial Impression / Assessment and Plan / UC Course  I have reviewed the triage vital signs and the nursing notes.  Pertinent labs & imaging results that were available during my care of the patient were reviewed by me and considered in my medical decision making (see chart for details).     Reviewed with patient that wound appears to be infected. Since he tolerated Amoxicillin well  last night and can be appropriate for skin infections, will start Augmentin 875mg  twice a day for 7 days. Continue to wash area with soap and water. May need to soak finger to help facilitate drainage of any discharge under skin. Keep area covered when working. May take Tylenol 1000mg  or Ibuprofen 600mg  every 8 hours as needed for pain. Follow-up in 3 to 4 days if not improving.  Final Clinical Impressions(s) / UC Diagnoses   Final diagnoses:  Laceration of finger with infection, initial encounter  Swelling of right middle finger     Discharge Instructions      Recommend start Augmentin 875mg  twice a day for 7 days. Continue to wash area with soap and water. Keep area covered when working. May take Tylenol 1000mg  or Ibuprofen 600mg  every 8 hours as needed for pain. Follow-up in 3 to 4 days if not improving.     ED Prescriptions     Medication Sig Dispense Auth. Provider   amoxicillin-clavulanate (AUGMENTIN) 875-125 MG tablet Take 1 tablet by mouth every 12  (twelve) hours for 7 days. 14 tablet Zeidy Tayag, Nicholes Stairs, NP      PDMP not reviewed this encounter.   Katy Apo, NP 11/14/21 [phone]

## 2021-11-13 NOTE — Discharge Instructions (Addendum)
Recommend start Augmentin 875mg  twice a day for 7 days. Continue to wash area with soap and water. Keep area covered when working. May take Tylenol 1000mg  or Ibuprofen 600mg  every 8 hours as needed for pain. Follow-up in 3 to 4 days if not improving.

## 2021-11-17 ENCOUNTER — Ambulatory Visit: Payer: Medicare Other | Admitting: Family Medicine

## 2022-01-29 ENCOUNTER — Telehealth: Payer: Self-pay | Admitting: Family Medicine

## 2022-01-29 NOTE — Telephone Encounter (Signed)
Left message for patient to call back and schedule Medicare Annual Wellness Visit (AWV) to be done virtually or by telephone. ? ?No hx of AWV eligible as of 01/26/22 ? ?Please schedule at anytime with [facility].     ? ? ? ?Any questions, please call me at [phone]  ?

## 2022-01-30 ENCOUNTER — Ambulatory Visit: Payer: Medicare Other

## 2022-02-06 ENCOUNTER — Ambulatory Visit (INDEPENDENT_AMBULATORY_CARE_PROVIDER_SITE_OTHER): Payer: Medicare Other

## 2022-02-06 VITALS — Wt 204.0 lb

## 2022-02-06 DIAGNOSIS — Z0000 Encounter for general adult medical examination without abnormal findings: Secondary | ICD-10-CM | POA: Diagnosis not present

## 2022-02-06 DIAGNOSIS — Z1211 Encounter for screening for malignant neoplasm of colon: Secondary | ICD-10-CM | POA: Diagnosis not present

## 2022-02-06 NOTE — Patient Instructions (Signed)
Mr. Jesse Collier , ?Thank you for taking time to come for your Medicare Wellness Visit. I appreciate your ongoing commitment to your health goals. Please review the following plan we discussed and let me know if I can assist you in the future.  ? ?Screening recommendations/referrals: ?Colonoscopy: referral sent  ?Recommended yearly ophthalmology/optometry visit for glaucoma screening and checkup ?Recommended yearly dental visit for hygiene and checkup ? ?Vaccinations: ?Influenza vaccine: n/d ?Pneumococcal vaccine: n/d ?Tdap vaccine: 12/23/19 ?Shingles vaccine: n/d   ?Covid-19: 01/06/20, 01/31/20 ? ?Advanced directives: no ? ?Conditions/risks identified: none ? ?Next appointment: Follow up in one year for your annual wellness visit. 02/12/23 @ 3pm by phone ? ?Preventive Care 55 Years and Older, Male ?Preventive care refers to lifestyle choices and visits with your health care provider that can promote health and wellness. ?What does preventive care include? ?A yearly physical exam. This is also called an annual well check. ?Dental exams once or twice a year. ?Routine eye exams. Ask your health care provider how often you should have your eyes checked. ?Personal lifestyle choices, including: ?Daily care of your teeth and gums. ?Regular physical activity. ?Eating a healthy diet. ?Avoiding tobacco and drug use. ?Limiting alcohol use. ?Practicing safe sex. ?Taking low doses of aspirin every day. ?Taking vitamin and mineral supplements as recommended by your health care provider. ?What happens during an annual well check? ?The services and screenings done by your health care provider during your annual well check will depend on your age, overall health, lifestyle risk factors, and family history of disease. ?Counseling  ?Your health care provider may ask you questions about your: ?Alcohol use. ?Tobacco use. ?Drug use. ?Emotional well-being. ?Home and relationship well-being. ?Sexual activity. ?Eating habits. ?History of  falls. ?Memory and ability to understand (cognition). ?Work and work Statistician. ?Screening  ?You may have the following tests or measurements: ?Height, weight, and BMI. ?Blood pressure. ?Lipid and cholesterol levels. These may be checked every 5 years, or more frequently if you are over 26 years old. ?Skin check. ?Lung cancer screening. You may have this screening every year starting at age 34 if you have a 30-pack-year history of smoking and currently smoke or have quit within the past 15 years. ?Fecal occult blood test (FOBT) of the stool. You may have this test every year starting at age 18. ?Flexible sigmoidoscopy or colonoscopy. You may have a sigmoidoscopy every 5 years or a colonoscopy every 10 years starting at age 68. ?Prostate cancer screening. Recommendations will vary depending on your family history and other risks. ?Hepatitis C blood test. ?Hepatitis B blood test. ?Sexually transmitted disease (STD) testing. ?Diabetes screening. This is done by checking your blood sugar (glucose) after you have not eaten for a while (fasting). You may have this done every 1-3 years. ?Abdominal aortic aneurysm (AAA) screening. You may need this if you are a current or former smoker. ?Osteoporosis. You may be screened starting at age 85 if you are at high risk. ?Talk with your health care provider about your test results, treatment options, and if necessary, the need for more tests. ?Vaccines  ?Your health care provider may recommend certain vaccines, such as: ?Influenza vaccine. This is recommended every year. ?Tetanus, diphtheria, and acellular pertussis (Tdap, Td) vaccine. You may need a Td booster every 10 years. ?Zoster vaccine. You may need this after age 59. ?Pneumococcal 13-valent conjugate (PCV13) vaccine. One dose is recommended after age 67. ?Pneumococcal polysaccharide (PPSV23) vaccine. One dose is recommended after age 57. ?Talk to your health care  provider about which screenings and vaccines you need and  how often you need them. ?This information is not intended to replace advice given to you by your health care provider. Make sure you discuss any questions you have with your health care provider. ?Document Released: 10/11/2015 Document Revised: 06/03/2016 Document Reviewed: 07/16/2015 ?Elsevier Interactive Patient Education ? 2017 [place]. ? ?Fall Prevention in the Home ?Falls can cause injuries. They can happen to people of all ages. There are many things you can do to make your home safe and to help prevent falls. ?What can I do on the outside of my home? ?Regularly fix the edges of walkways and driveways and fix any cracks. ?Remove anything that might make you trip as you walk through a door, such as a raised step or threshold. ?Trim any bushes or trees on the path to your home. ?Use bright outdoor lighting. ?Clear any walking paths of anything that might make someone trip, such as rocks or tools. ?Regularly check to see if handrails are loose or broken. Make sure that both sides of any steps have handrails. ?Any raised decks and porches should have guardrails on the edges. ?Have any leaves, snow, or ice cleared regularly. ?Use sand or salt on walking paths during winter. ?Clean up any spills in your garage right away. This includes oil or grease spills. ?What can I do in the bathroom? ?Use night lights. ?Install grab bars by the toilet and in the tub and shower. Do not use towel bars as grab bars. ?Use non-skid mats or decals in the tub or shower. ?If you need to sit down in the shower, use a plastic, non-slip stool. ?Keep the floor dry. Clean up any water that spills on the floor as soon as it happens. ?Remove soap buildup in the tub or shower regularly. ?Attach bath mats securely with double-sided non-slip rug tape. ?Do not have throw rugs and other things on the floor that can make you trip. ?What can I do in the bedroom? ?Use night lights. ?Make sure that you have a light by your bed that is easy to  reach. ?Do not use any sheets or blankets that are too big for your bed. They should not hang down onto the floor. ?Have a firm chair that has side arms. You can use this for support while you get dressed. ?Do not have throw rugs and other things on the floor that can make you trip. ?What can I do in the kitchen? ?Clean up any spills right away. ?Avoid walking on wet floors. ?Keep items that you use a lot in easy-to-reach places. ?If you need to reach something above you, use a strong step stool that has a grab bar. ?Keep electrical cords out of the way. ?Do not use floor polish or wax that makes floors slippery. If you must use wax, use non-skid floor wax. ?Do not have throw rugs and other things on the floor that can make you trip. ?What can I do with my stairs? ?Do not leave any items on the stairs. ?Make sure that there are handrails on both sides of the stairs and use them. Fix handrails that are broken or loose. Make sure that handrails are as long as the stairways. ?Check any carpeting to make sure that it is firmly attached to the stairs. Fix any carpet that is loose or worn. ?Avoid having throw rugs at the top or bottom of the stairs. If you do have throw rugs, attach them to the  floor with carpet tape. ?Make sure that you have a light switch at the top of the stairs and the bottom of the stairs. If you do not have them, ask someone to add them for you. ?What else can I do to help prevent falls? ?Wear shoes that: ?Do not have high heels. ?Have rubber bottoms. ?Are comfortable and fit you well. ?Are closed at the toe. Do not wear sandals. ?If you use a stepladder: ?Make sure that it is fully opened. Do not climb a closed stepladder. ?Make sure that both sides of the stepladder are locked into place. ?Ask someone to hold it for you, if possible. ?Clearly mark and make sure that you can see: ?Any grab bars or handrails. ?First and last steps. ?Where the edge of each step is. ?Use tools that help you move  around (mobility aids) if they are needed. These include: ?Canes. ?Walkers. ?Scooters. ?Crutches. ?Turn on the lights when you go into a dark area. Replace any light bulbs as soon as they burn out. ?Set up your furnitur

## 2022-02-06 NOTE — Progress Notes (Signed)
?Virtual Visit via Telephone Note ? ?I connected with  Jesse Collier on 02/06/22 at  3:00 PM EDT by telephone and verified that I am speaking with the correct person using two identifiers. ? ?Location: ?Patient: home ?Provider: [facility] ?Persons participating in the virtual visit: patient/Nurse Health Advisor ?  ?I discussed the limitations, risks, security and privacy concerns of performing an evaluation and management service by telephone and the availability of in person appointments. The patient expressed understanding and agreed to proceed. ? ?Interactive audio and video telecommunications were attempted between this nurse and patient, however failed, due to patient having technical difficulties OR patient did not have access to video capability.  We continued and completed visit with audio only. ? ?Some vital signs may be absent or patient reported.  ? ?Dionisio David, LPN ? ?Subjective:  ? Jesse Collier is a 66 y.o. male who presents for Medicare Annual/Subsequent preventive examination. ? ?Review of Systems    ? ?  ? ?   ?Objective:  ?  ?There were no vitals filed for this visit. ?There is no height or weight on file to calculate BMI. ? ? ?  11/13/2021  ?  8:12 AM  ?Advanced Directives  ?Does Patient Have a Medical Advance Directive? No  ? ? ?Current Medications (verified) ?Outpatient Encounter Medications as of 02/06/2022  ?Medication Sig  ? gabapentin (NEURONTIN) 100 MG capsule Take 1 capsule by mouth 3 (three) times daily.  ? ibuprofen (ADVIL) 100 MG/5ML suspension Take by mouth.  ? psyllium (KONSYL) 33 % POWD Take by mouth.  ? SHINGRIX injection Inject 0.5 mL into muscle for shingles vaccine. Repeat dose in 2-6 months.  ? ?No facility-administered encounter medications on file as of 02/06/2022.  ? ? ?Allergies (verified) ?Gabapentin  ? ?History: ?Past Medical History:  ?Diagnosis Date  ? Arthritis   ? Prostate cancer ([place])   ? ?Past Surgical History:  ?Procedure Laterality Date  ? BOWEL RESECTION  2012  ?  swallowed toothpick  ? CARPAL TUNNEL RELEASE Left   ? COLONOSCOPY  11/2013  ? ulcer of terminal ileum  ? KNEE ARTHROSCOPY Right   ? MEDIAL PARTIAL KNEE REPLACEMENT Right   ? ?Family History  ?Problem Relation Age of Onset  ? CAD Mother   ? Colon cancer Neg Hx   ? Prostate cancer Neg Hx   ? ?Social History  ? ?Socioeconomic History  ? Marital status: Married  ?  Spouse name: Not on file  ? Number of children: Not on file  ? Years of education: Not on file  ? Highest education level: Not on file  ?Occupational History  ? Not on file  ?Tobacco Use  ? Smoking status: Some Days  ?  Packs/day: 0.25  ?  Types: Cigarettes  ? Smokeless tobacco: Never  ? Tobacco comments:  ?  1 to 1.5 ppd 30-40 years.  ?Substance and Sexual Activity  ? Alcohol use: No  ?  Alcohol/week: 0.0 standard drinks  ? Drug use: No  ? Sexual activity: Not on file  ?Other Topics Concern  ? Not on file  ?Social History Narrative  ? Not on file  ? ?Social Determinants of Health  ? ?Financial Resource Strain: Not on file  ?Food Insecurity: Not on file  ?Transportation Needs: Not on file  ?Physical Activity: Not on file  ?Stress: Not on file  ?Social Connections: Not on file  ? ? ?Tobacco Counseling ?Ready to quit: Not Answered ?Counseling given: Not Answered ?Tobacco comments: 1 to 1.5  ppd 30-40 years. ? ? ?Clinical Intake: ? ?Pre-visit preparation completed: Yes ? ?Pain : No/denies pain ? ?  ? ?Nutritional Risks: None ?Diabetes: No ? ?How often do you need to have someone help you when you read instructions, pamphlets, or other written materials from your doctor or pharmacy?: 1 - Never ? ?Diabetic?no ? ?Interpreter Needed?: No ? ?Information entered by :: Kirke Shaggy, LPN ? ? ?Activities of Daily Living ?   ? View : No data to display.  ?  ?  ?  ? ? ?Patient Care Team: ?Olin Hauser, DO as PCP - General (Family Medicine) ? ?Indicate any recent Medical Services you may have received from other than [facility] providers in the past year (date may be  approximate). ? ?   ?Assessment:  ? This is a routine wellness examination for Jesse Collier. ? ?Hearing/Vision screen ?No results found. ? ?Dietary issues and exercise activities discussed: ?  ? ? Goals Addressed   ?None ?  ? ?Depression Screen ? ?  04/08/2021  ?  2:34 PM 03/05/2021  ?  2:03 PM 02/03/2021  ? 10:06 AM  ?PHQ 2/9 Scores  ?PHQ - 2 Score 0 0 0  ?PHQ- 9 Score 0 0 1  ?  ?Fall Risk ? ?  04/08/2021  ?  2:34 PM 03/05/2021  ?  2:03 PM  ?Fall Risk   ?Falls in the past year? 0 0  ?Number falls in past yr: 0 0  ?Injury with Fall? 0 0  ?Follow up Falls evaluation completed Falls evaluation completed  ? ? ?FALL RISK PREVENTION PERTAINING TO THE HOME: ? ?Any stairs in or around the home? No  ?If so, are there any without handrails? No  ?Home free of loose throw rugs in walkways, pet beds, electrical cords, etc? Yes  ?Adequate lighting in your home to reduce risk of falls? Yes  ? ?ASSISTIVE DEVICES UTILIZED TO PREVENT FALLS: ? ?Life alert? No  ?Use of a cane, walker or w/c? No  ?Grab bars in the bathroom? No  ?Shower chair or bench in shower? No  ?Elevated toilet seat or a handicapped toilet? No  ? ? ?Cognitive Function:2 points 6CIT ?  ?  ?  ? ?Immunizations ?Immunization History  ?Administered Date(s) Administered  ? PFIZER(Purple Top)SARS-COV-2 Vaccination 01/06/2020, 01/31/2020  ? Tdap 09/29/2010, 12/23/2019  ? ? ?TDAP status: Up to date ? ?Flu Vaccine status: Declined, Education has been provided regarding the importance of this vaccine but patient still declined. Advised may receive this vaccine at local pharmacy or Health Dept. Aware to provide a copy of the vaccination record if obtained from local pharmacy or Health Dept. Verbalized acceptance and understanding. ? ?Pneumococcal vaccine status: Declined,  Education has been provided regarding the importance of this vaccine but patient still declined. Advised may receive this vaccine at local pharmacy or Health Dept. Aware to provide a copy of the vaccination record if  obtained from local pharmacy or Health Dept. Verbalized acceptance and understanding.  ? ?Covid-19 vaccine status: Completed vaccines ? ?Qualifies for Shingles Vaccine? Yes   ?Zostavax completed No   ?Shingrix Completed?: No.    Education has been provided regarding the importance of this vaccine. Patient has been advised to call insurance company to determine out of pocket expense if they have not yet received this vaccine. Advised may also receive vaccine at local pharmacy or Health Dept. Verbalized acceptance and understanding. ? ?Screening Tests ?Health Maintenance  ?Topic Date Due  ? Pneumonia Vaccine 110+ Years old (  1 - PCV) Never done  ? Fecal DNA (Cologuard)  Never done  ? Zoster Vaccines- Shingrix (1 of 2) Never done  ? COVID-19 Vaccine (3 - Booster for Pfizer series) 03/27/2020  ? INFLUENZA VACCINE  04/28/2022  ? TETANUS/TDAP  12/22/2029  ? Hepatitis C Screening  Completed  ? HIV Screening  Completed  ? HPV VACCINES  Aged Out  ? ? ?Health Maintenance ? ?Health Maintenance Due  ?Topic Date Due  ? Pneumonia Vaccine 58+ Years old (1 - PCV) Never done  ? Fecal DNA (Cologuard)  Never done  ? Zoster Vaccines- Shingrix (1 of 2) Never done  ? COVID-19 Vaccine (3 - Booster for Pfizer series) 03/27/2020  ? ? ?Colorectal cancer screening: Referral to GI placed today. Pt aware the office will call re: appt. ? ?Lung Cancer Screening: (Low Dose CT Chest recommended if Age 59-80 years, 30 pack-year currently smoking OR have quit w/in 15years.) does not qualify.  ? ? ?Additional Screening: ? ?Hepatitis C Screening: does qualify; Completed 02/26/21 ? ?Vision Screening: Recommended annual ophthalmology exams for early detection of glaucoma and other disorders of the eye. ?Is the patient up to date with their annual eye exam?  Yes  ?Who is the provider or what is the name of the office in which the patient attends annual eye exams? Dr. Wyatt Portela ?If pt is not established with a provider, would they like to be referred to a provider  to establish care? No .  ? ?Dental Screening: Recommended annual dental exams for proper oral hygiene ? ?Community Resource Referral / Chronic Care Management: ?CRR required this visit?  No  ? ?CCM required [facility]

## 2022-02-11 ENCOUNTER — Telehealth: Payer: Self-pay

## 2022-02-11 NOTE — Telephone Encounter (Signed)
CALLED PATIENT NO ANSWER LEFT VOICEMAIL FOR A CALL BACK ? ?

## 2022-05-15 ENCOUNTER — Other Ambulatory Visit: Payer: Self-pay | Admitting: Family Medicine

## 2022-05-15 ENCOUNTER — Ambulatory Visit (INDEPENDENT_AMBULATORY_CARE_PROVIDER_SITE_OTHER): Payer: Medicare Other | Admitting: Family Medicine

## 2022-05-15 ENCOUNTER — Encounter: Payer: Self-pay | Admitting: Family Medicine

## 2022-05-15 VITALS — BP 126/50 | HR 64 | Ht 71.0 in | Wt 180.6 lb

## 2022-05-15 DIAGNOSIS — Z0000 Encounter for general adult medical examination without abnormal findings: Secondary | ICD-10-CM | POA: Diagnosis not present

## 2022-05-15 DIAGNOSIS — R0989 Other specified symptoms and signs involving the circulatory and respiratory systems: Secondary | ICD-10-CM | POA: Diagnosis not present

## 2022-05-15 DIAGNOSIS — N529 Male erectile dysfunction, unspecified: Secondary | ICD-10-CM

## 2022-05-15 DIAGNOSIS — E7800 Pure hypercholesterolemia, unspecified: Secondary | ICD-10-CM | POA: Diagnosis not present

## 2022-05-15 DIAGNOSIS — Z1211 Encounter for screening for malignant neoplasm of colon: Secondary | ICD-10-CM | POA: Diagnosis not present

## 2022-05-15 DIAGNOSIS — N401 Enlarged prostate with lower urinary tract symptoms: Secondary | ICD-10-CM | POA: Diagnosis not present

## 2022-05-15 DIAGNOSIS — M1909 Primary osteoarthritis, other specified site: Secondary | ICD-10-CM

## 2022-05-15 DIAGNOSIS — R351 Nocturia: Secondary | ICD-10-CM

## 2022-05-15 DIAGNOSIS — R7309 Other abnormal glucose: Secondary | ICD-10-CM

## 2022-05-15 MED ORDER — TADALAFIL 5 MG PO TABS: 5.0000 mg | ORAL_TABLET | Freq: Every day | ORAL | 2 refills | Status: DC

## 2022-05-15 NOTE — Progress Notes (Signed)
Subjective:    Patient ID: Jesse Collier, male    DOB: 09-Dec-1955, 66 y.o.   MRN: [id number]  Jesse Collier is a 66 y.o. male presenting on 05/15/2022 for Annual Exam   HPI  Here for Annual Physical and Lab Review.   Osteoarthritis multiple joints Neuropathy, sensory He has had orthopedic consultation, was not diagnosed w/ Rheumatoid arthritis. Also seen [facility] in [place]. He has seen Ortho in [facility] - Dr Berenice Primas 5 years ago R partial knee replacement.   He has daily arthritis joint pain episodic No significant debilitation but he is in pain daily He is able to function working part time   He is prescribed Celebrex 100mg  BID. He takes most often. He does swap for Advil 400mg  in future PRN, if he takes one he does not take the other. - Tried OTC Blue Emu, [place] Cream, Voltaren topical, Tylenol. - Locations include feet, knees, hands - In AM hands are stiff and have some stiffness with movement - He has worked as Aeronautical engineer for years, prolong standing, not as much lifting - If not work he is staying active with golf, yard work Social research officer, government. - Admits mild tingling in bottoms of feet, very mild. Not painful.   Last visit 02/03/21,  For arthritis, he was prescribed and he took Gabapentin x 3 days, with 100mg  dose only for 3 days. He felt confusion and altered mental status was sent to hospital ED for evaluation. Discontinued Gabapentin and his symptoms resolved.   Today he is doing well no new concerns on arthritis.   History of Stomach Bug / Virus few months ago He had nausea vomiting diarrhea, GI illness. He lost 12-15 lbs. Now he has overhauled the diet and improved, no longer snacking , less junk foods. More veggies, fruits  ----------------------------  HYPERLIPIDEMIA: - Reports no concerns. Last lipid panel 04/2022, mostly normal. Not on rx medication  L Carotid bruit    BPH Nocturia Chronic problem with some BPH symptoms mostly with nocturia waking up few times to  void. Not impacting him much during day. Has failed tamsulosin or other alpha blocker made him sedated or groggy. - He also has chronic Erectile Dysfunction ED for years, tried most options including viagra - Interested to try Cialis and talk with Urologist.    Fam history shingles - brother had it. He has had chicken pox. Not had shingles vaccine.   Health Maintenance:   PSA 0.55 (02/2021) negative. No fam history of prostate.   Colon CA screening - Previous Colonoscopy due to emergency issue 2010, now 12 years later due for repeat, asymptomatic. He has cologuard kit we ordered last time, has not been ready to complete it yet.       05/15/2022    8:21 AM 02/06/2022    2:58 PM 04/08/2021    2:34 PM  Depression screen PHQ 2/9  Decreased Interest 3 0 0  Down, Depressed, Hopeless 2 0 0  PHQ - 2 Score 5 0 0  Altered sleeping 3  0  Tired, decreased energy 0  0  Change in appetite 1  0  Feeling bad or failure about yourself  1  0  Trouble concentrating 0  0  Moving slowly or fidgety/restless 0  0  Suicidal thoughts 0  0  PHQ-9 Score 10  0  Difficult doing work/chores Not difficult at all  Not difficult at all    Past Medical History:  Diagnosis Date   Arthritis    Prostate cancer (  [facility])    Past Surgical History:  Procedure Laterality Date   BOWEL RESECTION  2012   swallowed toothpick   CARPAL TUNNEL RELEASE Left    COLONOSCOPY  11/2013   ulcer of terminal ileum   KNEE ARTHROSCOPY Right    MEDIAL PARTIAL KNEE REPLACEMENT Right    Social History   Socioeconomic History   Marital status: Married    Spouse name: Not on file   Number of children: Not on file   Years of education: Not on file   Highest education level: Not on file  Occupational History   Not on file  Tobacco Use   Smoking status: Some Days    Packs/day: 0.25    Types: Cigarettes   Smokeless tobacco: Never   Tobacco comments:    1 to 1.5 ppd 30-40 years.  Substance and Sexual Activity   Alcohol use: No     Alcohol/week: 0.0 standard drinks of alcohol   Drug use: No   Sexual activity: Not on file  Other Topics Concern   Not on file  Social History Narrative   Not on file   Social Determinants of Health   Financial Resource Strain: Low Risk  (02/06/2022)   Overall Financial Resource Strain (CARDIA)    Difficulty of Paying Living Expenses: Not hard at all  Food Insecurity: No Food Insecurity (02/06/2022)   Hunger Vital Sign    Worried About Running Out of Food in the Last Year: Never true    Ran Out of Food in the Last Year: Never true  Transportation Needs: No Transportation Needs (02/06/2022)   PRAPARE - Hydrologist (Medical): No    Lack of Transportation (Non-Medical): No  Physical Activity: Sufficiently Active (02/06/2022)   Exercise Vital Sign    Days of Exercise per Week: 6 days    Minutes of Exercise per Session: 40 min  Stress: No Stress Concern Present (02/06/2022)   [place]    Feeling of Stress : Only a little  Social Connections: Moderately Isolated (02/06/2022)   Social Connection and Isolation Panel [NHANES]    Frequency of Communication with Friends and Family: More than three times a week    Frequency of Social Gatherings with Friends and Family: More than three times a week    Attends Religious Services: Never    Marine scientist or Organizations: No    Attends Archivist Meetings: Never    Marital Status: Married  Human resources officer Violence: Not At Risk (02/06/2022)   Humiliation, Afraid, Rape, and Kick questionnaire    Fear of Current or Ex-Partner: No    Emotionally Abused: No    Physically Abused: No    Sexually Abused: No   Family History  Problem Relation Age of Onset   CAD Mother    Colon cancer Neg Hx    Prostate cancer Neg Hx    Current Outpatient Medications on File Prior to Visit  Medication Sig   ibuprofen (ADVIL) 100 MG/5ML suspension  Take by mouth.   SHINGRIX injection Inject 0.5 mL into muscle for shingles vaccine. Repeat dose in 2-6 months.   psyllium (KONSYL) 33 % POWD Take by mouth. (Patient not taking: Reported on 02/06/2022)   No current facility-administered medications on file prior to visit.    Review of Systems  Constitutional:  Negative for activity change, appetite change, chills, diaphoresis, fatigue and fever.  HENT:  Negative for congestion and  hearing loss.   Eyes:  Negative for visual disturbance.  Respiratory:  Negative for cough, chest tightness, shortness of breath and wheezing.   Cardiovascular:  Negative for chest pain, palpitations and leg swelling.  Gastrointestinal:  Negative for abdominal pain, constipation, diarrhea, nausea and vomiting.  Genitourinary:  Negative for dysuria, frequency and hematuria.  Musculoskeletal:  Negative for arthralgias and neck pain.  Skin:  Negative for rash.  Neurological:  Negative for dizziness, weakness, light-headedness, numbness and headaches.  Hematological:  Negative for adenopathy.  Psychiatric/Behavioral:  Negative for behavioral problems, dysphoric mood and sleep disturbance.    Per HPI unless specifically indicated above     Objective:    BP (!) 126/50   Pulse 64   Ht $R'5\' 11"'Fb$  (1.803 m)   Wt 180 lb 9.6 oz (81.9 kg)   SpO2 98%   BMI 25.19 kg/m   Wt Readings from Last 3 Encounters:  05/15/22 180 lb 9.6 oz (81.9 kg)  02/06/22 204 lb (92.5 kg)  11/13/21 204 lb (92.5 kg)    Physical Exam Vitals and nursing note reviewed.  Constitutional:      General: He is not in acute distress.    Appearance: He is well-developed. He is not diaphoretic.     Comments: Well-appearing, comfortable, cooperative  HENT:     Head: Normocephalic and atraumatic.  Eyes:     General:        Right eye: No discharge.        Left eye: No discharge.     Conjunctiva/sclera: Conjunctivae normal.     Pupils: Pupils are equal, round, and reactive to light.  Neck:      Thyroid: No thyromegaly.     Vascular: Carotid bruit (L side only) present.  Cardiovascular:     Rate and Rhythm: Normal rate and regular rhythm.     Pulses: Normal pulses.     Heart sounds: Normal heart sounds. No murmur heard. Pulmonary:     Effort: Pulmonary effort is normal. No respiratory distress.     Breath sounds: Normal breath sounds. No wheezing or rales.  Abdominal:     General: Bowel sounds are normal. There is no distension.     Palpations: Abdomen is soft. There is no mass.     Tenderness: There is no abdominal tenderness.  Musculoskeletal:        General: No tenderness. Normal range of motion.     Cervical back: Normal range of motion and neck supple.     Right lower leg: No edema.     Left lower leg: No edema.     Comments: Upper / Lower Extremities: - Normal muscle tone, strength bilateral upper extremities 5/5, lower extremities 5/5  Lymphadenopathy:     Cervical: No cervical adenopathy.  Skin:    General: Skin is warm and dry.     Findings: No erythema or rash.  Neurological:     Mental Status: He is alert and oriented to person, place, and time.     Comments: Distal sensation intact to light touch all extremities  Psychiatric:        Mood and Affect: Mood normal.        Behavior: Behavior normal.        Thought Content: Thought content normal.     Comments: Well groomed, good eye contact, normal speech and thoughts      Results for orders placed or performed in visit on 02/26/21  HIV Antibody (routine testing w rflx)  Result Value  Ref Range   HIV 1&2 Ab, 4th Generation NON-REACTIVE NON-REACTIVE  Hepatitis C antibody  Result Value Ref Range   Hepatitis C Ab NON-REACTIVE NON-REACTIVE   SIGNAL TO CUT-OFF 0.03 <1.00  PSA  Result Value Ref Range   PSA 0.55 < OR = 4.00 ng/mL  Lipid panel  Result Value Ref Range   Cholesterol 195 <200 mg/dL   HDL 55 > OR = 40 mg/dL   Triglycerides 162 (H) <150 mg/dL   LDL Cholesterol (Calc) 112 (H) mg/dL (calc)    Total CHOL/HDL Ratio 3.5 <5.0 (calc)   Non-HDL Cholesterol (Calc) 140 (H) <130 mg/dL (calc)  COMPLETE METABOLIC PANEL WITH GFR  Result Value Ref Range   Glucose, Bld 80 65 - 99 mg/dL   BUN 21 7 - 25 mg/dL   Creat 1.07 0.70 - 1.25 mg/dL   GFR, Est Non African American 72 > OR = 60 mL/min/1.41m2   GFR, Est African American 84 > OR = 60 mL/min/1.75m2   BUN/Creatinine Ratio NOT APPLICABLE 6 - 22 (calc)   Sodium 140 135 - 146 mmol/L   Potassium 4.3 3.5 - 5.3 mmol/L   Chloride 106 98 - 110 mmol/L   CO2 25 20 - 32 mmol/L   Calcium 9.2 8.6 - 10.3 mg/dL   Total Protein 6.8 6.1 - 8.1 g/dL   Albumin 4.5 3.6 - 5.1 g/dL   Globulin 2.3 1.9 - 3.7 g/dL (calc)   AG Ratio 2.0 1.0 - 2.5 (calc)   Total Bilirubin 0.6 0.2 - 1.2 mg/dL   Alkaline phosphatase (APISO) 77 35 - 144 U/L   AST 17 10 - 35 U/L   ALT 11 9 - 46 U/L  CBC with Differential/Platelet  Result Value Ref Range   WBC 5.2 3.8 - 10.8 Thousand/uL   RBC 4.71 4.20 - 5.80 Million/uL   Hemoglobin 14.5 13.2 - 17.1 g/dL   HCT 45.7 38.5 - 50.0 %   MCV 97.0 80.0 - 100.0 fL   MCH 30.8 27.0 - 33.0 pg   MCHC 31.7 (L) 32.0 - 36.0 g/dL   RDW 12.0 11.0 - 15.0 %   Platelets 132 (L) 140 - 400 Thousand/uL   MPV 11.4 7.5 - 12.5 fL   Neutro Abs 3,172 1,500 - 7,800 cells/uL   Lymphs Abs 1,596 850 - 3,900 cells/uL   Absolute Monocytes 400 200 - 950 cells/uL   Eosinophils Absolute 0 (L) 15 - 500 cells/uL   Basophils Absolute 31 0 - 200 cells/uL   Neutrophils Relative % 61 %   Total Lymphocyte 30.7 %   Monocytes Relative 7.7 %   Eosinophils Relative 0.0 %   Basophils Relative 0.6 %  Hemoglobin A1c  Result Value Ref Range   Hgb A1c MFr Bld 5.5 <5.7 % of total Hgb   Mean Plasma Glucose 111 mg/dL   eAG (mmol/L) 6.2 mmol/L      Assessment & Plan:   Problem List Items Addressed This Visit     BPH associated with nocturia   Relevant Medications   tadalafil (CIALIS) 5 MG tablet   Other Relevant Orders   PSA   DJD (degenerative joint disease)    Pure hypercholesterolemia   Relevant Medications   tadalafil (CIALIS) 5 MG tablet   Other Relevant Orders   COMPLETE METABOLIC PANEL WITH GFR   Lipid panel   TSH   Other Visit Diagnoses     Annual physical exam    -  Primary   Relevant Orders   COMPLETE METABOLIC  PANEL WITH GFR   CBC with Differential/Platelet   Lipid panel   Hemoglobin A1c   Screening for colon cancer       Relevant Orders   Ambulatory referral to Gastroenterology   Erectile dysfunction, unspecified erectile dysfunction type       Abnormal glucose       Relevant Orders   Hemoglobin A1c       Updated Health Maintenance information Fasting labs ordered. Pending Lipid, calculate ASCVD. Reviewed prevention Encouraged improvement to lifestyle with diet and exercise Goal of weight loss  Ordered Colonoscopy referral  L Carotid Bruit Newly identified, mild. No other murmur Asymptomatic Offer future Carotid doppler, declines today, will reconsider  Consider Low Dose CT Chest screening for Lung Cancer - eligible 50-80 yrs, smoking history.  Future vaccines Shingrix, Prevnar 20  BPH Nocturia ED Chronic problems Has failed Tamsulosin in past. Also failed Viagra for ED Consider Finasteride Given his concurrent ED issue, will trial generic Cialis Tadalafil 46m daily if approved, or can use Goodrx Additionally would consider referral to Urology for consult  Orders Placed This Encounter  Procedures   COMPLETE METABOLIC PANEL WITH GFR   CBC with Differential/Platelet   Lipid panel    Order Specific Question:   Has the patient fasted?    Answer:   Yes   Hemoglobin A1c   PSA   TSH   Ambulatory referral to Gastroenterology    Referral Priority:   Routine    Referral Type:   Consultation    Referral Reason:   Specialty Services Required    Number of Visits Requested:   1     Meds ordered this encounter  Medications   tadalafil (CIALIS) 5 MG tablet    Sig: Take 1 tablet (5 mg total) by mouth daily.     Dispense:  30 tablet    Refill:  2      Follow up plan: Return in about 1 year (around 05/16/2023) for 1 year Annual Physical AM fasting lab after.   ANobie Putnam [facility] 05/15/2022, 8:15 AM

## 2022-05-15 NOTE — Patient Instructions (Addendum)
Thank you for coming to the office today.  Referral sent again to GI for Colonoscopy.  [facility]) [address], [place] [address] Phone: [phone]  Future consider Shingles vaccine 2 x doses 2-6 month apart if interested.  Future consider Pneumonia vaccine Prevnar 20 one dose and done forever if interested  --------------------------------------------  Consider Low Dose CT Chest screening for Lung Cancer - eligible 50-80 yrs, smoking history.  Should be free, let me know if you are interested and I can refer you.  ---------------  ALso await a call from  Urology  [facility] -1st floor [place],  [place]  [address] Phone: [phone]  ---------------------------------------  Future Recommend Carotid Doppler US for neck L side with bruit abnormal sound.   Please schedule a Follow-up Appointment to: Return in about 1 year (around 05/16/2023) for 1 year Annual Physical AM fasting lab after.  If you have any other questions or concerns, please feel free to call the office or send a message through [place]. You may also schedule an earlier appointment if necessary.  Additionally, you may be receiving a survey about your experience at our office within a few days to 1 week by e-mail or mail. We value your feedback.  [name], DO [place]

## 2022-05-16 LAB — COMPLETE METABOLIC PANEL WITH GFR
AG Ratio: 2.2 (calc) (ref 1.0–2.5)
ALT: 13 U/L (ref 9–46)
AST: 21 U/L (ref 10–35)
Albumin: 4.6 g/dL (ref 3.6–5.1)
Alkaline phosphatase (APISO): 56 U/L (ref 35–144)
BUN: 25 mg/dL (ref 7–25)
CO2: 26 mmol/L (ref 20–32)
Calcium: 9.6 mg/dL (ref 8.6–10.3)
Chloride: 105 mmol/L (ref 98–110)
Creat: 1.31 mg/dL (ref 0.70–1.35)
Globulin: 2.1 g/dL (calc) (ref 1.9–3.7)
Glucose, Bld: 101 mg/dL (ref 65–139)
Potassium: 4.8 mmol/L (ref 3.5–5.3)
Sodium: 139 mmol/L (ref 135–146)
Total Bilirubin: 0.4 mg/dL (ref 0.2–1.2)
Total Protein: 6.7 g/dL (ref 6.1–8.1)
eGFR: 60 mL/min/{1.73_m2} (ref >=60)

## 2022-05-16 LAB — LIPID PANEL
Cholesterol: 191 mg/dL (ref <200)
HDL: 66 mg/dL (ref >=40)
LDL Cholesterol (Calc): 111 mg/dL (calc) — ABNORMAL HIGH
Non-HDL Cholesterol (Calc): 125 mg/dL (calc) (ref <130)
Total CHOL/HDL Ratio: 2.9 (calc) (ref <5.0)
Triglycerides: 62 mg/dL (ref <150)

## 2022-05-16 LAB — CBC WITH DIFFERENTIAL/PLATELET
Absolute Monocytes: 355 cells/uL (ref 200–950)
Basophils Absolute: 19 cells/uL (ref 0–200)
Basophils Relative: 0.4 %
Eosinophils Absolute: 0 cells/uL — ABNORMAL LOW (ref 15–500)
Eosinophils Relative: 0 %
HCT: 41.7 % (ref 38.5–50.0)
Hemoglobin: 13.9 g/dL (ref 13.2–17.1)
Lymphs Abs: 1339 cells/uL (ref 850–3900)
MCH: 31.7 pg (ref 27.0–33.0)
MCHC: 33.3 g/dL (ref 32.0–36.0)
MCV: 95.2 fL (ref 80.0–100.0)
MPV: 11.2 fL (ref 7.5–12.5)
Monocytes Relative: 7.4 %
Neutro Abs: 3086 cells/uL (ref 1500–7800)
Neutrophils Relative %: 64.3 %
Platelets: 150 10*3/uL (ref 140–400)
RBC: 4.38 10*6/uL (ref 4.20–5.80)
RDW: 12.2 % (ref 11.0–15.0)
Total Lymphocyte: 27.9 %
WBC: 4.8 10*3/uL (ref 3.8–10.8)

## 2022-05-16 LAB — TSH: TSH: 1.51 mIU/L (ref 0.40–4.50)

## 2022-05-16 LAB — HEMOGLOBIN A1C
Hgb A1c MFr Bld: 5.4 % of total Hgb (ref <5.7)
Mean Plasma Glucose: 108 mg/dL
eAG (mmol/L): 6 mmol/L

## 2022-05-16 LAB — PSA: PSA: 0.57 ng/mL (ref <=4.00)

## 2022-05-18 ENCOUNTER — Telehealth: Payer: Self-pay | Admitting: Gastroenterology

## 2022-05-18 ENCOUNTER — Telehealth: Payer: Self-pay

## 2022-05-18 ENCOUNTER — Other Ambulatory Visit: Payer: Self-pay

## 2022-05-18 DIAGNOSIS — Z1211 Encounter for screening for malignant neoplasm of colon: Secondary | ICD-10-CM

## 2022-05-18 MED ORDER — NA SULFATE-K SULFATE-MG SULF 17.5-3.13-1.6 GM/177ML PO SOLN: 1.0000 | Freq: Once | ORAL | 0 refills | Status: AC

## 2022-05-18 NOTE — Telephone Encounter (Signed)
Patient needs to reschedule colonoscopy. Please return patient call.

## 2022-05-18 NOTE — Telephone Encounter (Signed)
Returned patients call to reschedule his colonoscopy.  LVM for him to call me back.  Thanks,  [place], [place]

## 2022-05-18 NOTE — Telephone Encounter (Signed)
Gastroenterology Pre-Procedure Review  Request Date: 06/16/22 Requesting Physician: Dr. Vicente Males  PATIENT REVIEW QUESTIONS: The patient responded to the following health history questions as indicated:    1. Are you having any GI issues? no 2. Do you have a personal history of Polyps? no 3. Do you have a family history of Colon Cancer or Polyps? no 4. Diabetes Mellitus? no 5. Joint replacements in the past 12 months?no 6. Major health problems in the past 3 months?no 7. Any artificial heart valves, MVP, or defibrillator?no    MEDICATIONS & ALLERGIES:    Patient reports the following regarding taking any anticoagulation/antiplatelet therapy:   Plavix, Coumadin, Eliquis, Xarelto, Lovenox, Pradaxa, Brilinta, or Effient? no Aspirin? no  Patient confirms/reports the following medications:  Current Outpatient Medications  Medication Sig Dispense Refill   ibuprofen (ADVIL) 100 MG/5ML suspension Take by mouth.     psyllium (KONSYL) 33 % POWD Take by mouth. (Patient not taking: Reported on 02/06/2022)     SHINGRIX injection Inject 0.5 mL into muscle for shingles vaccine. Repeat dose in 2-6 months. 0.5 mL 1   tadalafil (CIALIS) 5 MG tablet Take 1 tablet (5 mg total) by mouth daily. 30 tablet 2   No current facility-administered medications for this visit.    Patient confirms/reports the following allergies:  Allergies  Allergen Reactions   Gabapentin     Severe confusion     No orders of the defined types were placed in this encounter.   AUTHORIZATION INFORMATION Primary Insurance: 1D#: Group #:  Secondary Insurance: 1D#: Group #:  SCHEDULE INFORMATION: Date: 06/16/22 Time: Location: [facility]

## 2022-05-19 ENCOUNTER — Other Ambulatory Visit: Payer: Self-pay

## 2022-05-19 DIAGNOSIS — Z1211 Encounter for screening for malignant neoplasm of colon: Secondary | ICD-10-CM

## 2022-05-19 MED ORDER — PEG 3350-KCL-NA BICARB-NACL 420 G PO SOLR: 4000.0000 mL | Freq: Once | ORAL | 0 refills | Status: AC

## 2022-05-21 ENCOUNTER — Other Ambulatory Visit: Payer: Self-pay

## 2022-05-21 DIAGNOSIS — N401 Enlarged prostate with lower urinary tract symptoms: Secondary | ICD-10-CM

## 2022-05-22 ENCOUNTER — Encounter: Payer: Self-pay | Admitting: Urology

## 2022-05-22 ENCOUNTER — Ambulatory Visit: Payer: Medicare Other | Admitting: Urology

## 2022-05-22 ENCOUNTER — Other Ambulatory Visit
Admission: RE | Admit: 2022-05-22 | Discharge: 2022-05-22 | Disposition: A | Discharge status: Home / Self Care | Payer: Medicare Other | Attending: Urology | Admitting: Urology

## 2022-05-22 VITALS — BP 114/47 | HR 64 | Ht 71.0 in | Wt 179.0 lb

## 2022-05-22 DIAGNOSIS — Z125 Encounter for screening for malignant neoplasm of prostate: Secondary | ICD-10-CM | POA: Diagnosis not present

## 2022-05-22 DIAGNOSIS — R351 Nocturia: Secondary | ICD-10-CM | POA: Diagnosis not present

## 2022-05-22 DIAGNOSIS — N529 Male erectile dysfunction, unspecified: Secondary | ICD-10-CM | POA: Diagnosis not present

## 2022-05-22 DIAGNOSIS — N401 Enlarged prostate with lower urinary tract symptoms: Secondary | ICD-10-CM | POA: Diagnosis not present

## 2022-05-22 LAB — URINALYSIS, COMPLETE (UACMP) WITH MICROSCOPIC
Bilirubin Urine: NEGATIVE
Glucose, UA: NEGATIVE mg/dL
Hgb urine dipstick: NEGATIVE
Ketones, ur: NEGATIVE mg/dL
Leukocytes,Ua: NEGATIVE
Nitrite: NEGATIVE
Protein, ur: NEGATIVE mg/dL
RBC / HPF: NONE SEEN RBC/hpf (ref 0–5)
Specific Gravity, Urine: 1.02 (ref 1.005–1.030)
Squamous Epithelial / HPF: NONE SEEN (ref 0–5)
WBC, UA: NONE SEEN WBC/hpf (ref 0–5)
pH: 5.5 (ref 5.0–8.0)

## 2022-05-22 LAB — BLADDER SCAN AMB NON-IMAGING

## 2022-05-22 NOTE — Progress Notes (Signed)
05/22/22 9:36 AM   Jesse Collier March 01, 1956 [id number]  Referring provider:  Olin Hauser, DO [address] [place],  [place] [address] Chief Complaint  Patient presents with   Benign Prostatic Hypertrophy   Erectile Dysfunction    New Patient     HPI: Jesse Collier is a 66 y.o.male who presents today for further evaluation of BPH symptoms with nocturia and erectile dysfunction.  He was seen by his PCP, Dr  Jesse Collier on 05/15/2022. He was noted to have some BPH symptoms with mostly nocturia waking up a few times to avoid during the night time, but it was not impacting him much during the day. He had failed tamsulosin and other alpha blockers made him sedated or groggy. He has tried most PDE 5 inhibitors for his erectile dysfunction without improvement but he was interested in trying Cialis.  His most recent PSA on 05/15/2022 was 0.57.  He reports urinary frequency. He does not remember if he took Flomax. His nocturia improved on Cialis. It however has not improved his erection; He has tried Viagra with no improvement. He took testosterone supplements on the past. His libido is still intact.  He reports that he had erectile dysfunction for 51 years.  His wife is less interested in sexual activity.  He reports urinary frequency, urgency, and weak stream. He reports that he drink 5 bottles of water daily. He drink 2 cups of coffee. He has lost 28lbs and continues to exercise and diet.   IPSS 21 and PVR 32 ml.    IPSS     Row Name 05/22/22 0900         International Prostate Symptom Score   How often have you had the sensation of not emptying your bladder? About half the time     How often have you had to urinate less than every two hours? Almost always     How often have you found you stopped and started again several times when you urinated? About half the time     How often have you found it difficult to postpone urination? About half the time     How often  have you had a weak urinary stream? About half the time     How often have you had to strain to start urination? Less than 1 in 5 times     How many times did you typically get up at night to urinate? 3 Times     Total IPSS Score 21       Quality of Life due to urinary symptoms   If you were to spend the rest of your life with your urinary condition just the way it is now how would you feel about that? Unhappy              Score:  1-7 Mild 8-19 Moderate 20-35 Severe   PMH: Past Medical History:  Diagnosis Date   Arthritis    BPH associated with nocturia 02/03/2021   DJD (degenerative joint disease) 02/03/2021   Neuropathy 08/28/2021   Numbness 08/28/2021   Pure hypercholesterolemia 03/05/2021    Surgical History: Past Surgical History:  Procedure Laterality Date   BOWEL RESECTION  2012   swallowed toothpick   CARPAL TUNNEL RELEASE Left    COLONOSCOPY  11/2013   ulcer of terminal ileum   KNEE ARTHROSCOPY Right    MEDIAL PARTIAL KNEE REPLACEMENT Right     Home Medications:  Allergies as of 05/22/2022  Reactions   Gabapentin    Severe confusion        Medication List        Accurate as of May 22, 2022  9:36 AM. If you have any questions, ask your nurse or doctor.          ibuprofen 100 MG/5ML suspension Commonly known as: ADVIL Take by mouth.   psyllium 33 % Powd Commonly known as: KONSYL Take by mouth.   Shingrix injection Generic drug: Zoster Vaccine Adjuvanted Inject 0.5 mL into muscle for shingles vaccine. Repeat dose in 2-6 months.   tadalafil 5 MG tablet Commonly known as: CIALIS Take 1 tablet (5 mg total) by mouth daily.        Allergies:  Allergies  Allergen Reactions   Gabapentin     Severe confusion     Family History: Family History  Problem Relation Age of Onset   CAD Mother    Colon cancer Neg Hx    Prostate cancer Neg Hx    Bladder Cancer Neg Hx    Kidney cancer Neg Hx     Social History:  reports  that he has been smoking cigarettes and e-cigarettes. He started smoking about a year ago. He has been smoking an average of .25 packs per day. He has never used smokeless tobacco. He reports that he does not drink alcohol and does not use drugs.   Physical Exam: BP (!) 114/47   Pulse 64   Ht '5\' 11"'$  (1.803 m)   Wt 179 lb (81.2 kg)   BMI 24.97 kg/m   Constitutional:  Alert and oriented, No acute distress. HEENT: [place] AT, moist mucus membranes.  Trachea midline, no masses. Cardiovascular: No clubbing, cyanosis, or edema. Respiratory: Normal respiratory effort, no increased work of breathing. Rectal: Normal sphincter tone,  50  CC prostate, smooth no nodules Skin: No rashes, bruises or suspicious lesions. Neurologic: Grossly intact, no focal deficits, moving all 4 extremities. Psychiatric: Normal mood and affect.  Laboratory Data:  Lab Results  Component Value Date   CREATININE 1.31 05/15/2022   Lab Results  Component Value Date   HGBA1C 5.4 05/15/2022    Urinalysis Component     Latest Ref Rng 05/22/2022  Color, Urine     YELLOW  YELLOW   Appearance     CLEAR  CLEAR   Specific Gravity, Urine     1.005 - 1.030  1.020   pH     5.0 - 8.0  5.5   Glucose, UA     NEGATIVE mg/dL NEGATIVE   Hgb urine dipstick     NEGATIVE  NEGATIVE   Bilirubin Urine     NEGATIVE  NEGATIVE   Ketones, ur     NEGATIVE mg/dL NEGATIVE   Protein     NEGATIVE mg/dL NEGATIVE   Nitrite     NEGATIVE  NEGATIVE   Leukocytes,Ua     NEGATIVE  NEGATIVE   Squamous Epithelial / LPF     0 - 5  NONE SEEN   WBC, UA     0 - 5 WBC/hpf NONE SEEN   RBC / HPF     0 - 5 RBC/hpf NONE SEEN   Bacteria, UA     NONE SEEN  RARE !     Legend: ! Abnormal  Pertinent Imaging: Results for orders placed or performed in visit on 05/22/22  BLADDER SCAN AMB NON-IMAGING  Result Value Ref Range   Scan Result 58m     Assessment &  Plan:    BPH w/ urinary frequency  - Will further evaluate prostate anatomy and  bladder anatomy and health with a cystoscopy to rule out any underlying malignancy  -Tried and failed Flomax, will hold off on prescribing any additional information to we have a better understanding of his anatomy and rule out other etiologies -Personal history of smoking, concern for underlying conditions especially in the setting of primarily storage related symptoms - UA pending  2. Prostate cancer screening  - DRE; benign   3.  Erectile dysfunction -Likely vasculogenic -Strongly urged him to discuss this with his primary care physician, may benefit from cardiovascular evaluation especially with his smoking history and age -We did his alternatives to PDE 5 inhibitors which she is failed including intracavernosal injections, vacuum erection device and implant.  After lengthy discussion about risk and benefits of each, he does not want to pursue anything at this point in time.  Return for cysto/TRUS.  I,Kailey Littlejohn,acting as a Education administrator for Hollice Espy, MD.,have documented all relevant documentation on the behalf of Hollice Espy, MD,as directed by  Hollice Espy, MD while in the presence of Hollice Espy, MD.  I have reviewed the above documentation for accuracy and completeness, and I agree with the above.   Hollice Espy, MD   [facility] [address], [place], [place] [address] [phone]

## 2022-05-22 NOTE — Patient Instructions (Signed)
Cystoscopy Cystoscopy is a procedure that is used to help diagnose and sometimes treat conditions that affect the lower urinary tract. The lower urinary tract includes the bladder and the urethra. The urethra is the tube that drains urine from the bladder. Cystoscopy is done using a thin, tube-shaped instrument with a light and camera at the end (cystoscope). The cystoscope may be hard or flexible, depending on the goal of the procedure. The cystoscope is inserted through the urethra, into the bladder. Cystoscopy may be recommended if you have: Urinary tract infections that keep coming back. Blood in the urine (hematuria). An inability to control when you urinate (urinary incontinence) or an overactive bladder. Unusual cells found in a urine sample. A blockage in the urethra, such as a urinary stone. Painful urination. An abnormality in the bladder found during an intravenous pyelogram (IVP) or CT scan. What are the risks? Generally, this is a safe procedure. However, problems may occur, including: Infection. Bleeding.  What happens during the procedure?  You will be given one or more of the following: A medicine to numb the area (local anesthetic). The area around the opening of your urethra will be cleaned. The cystoscope will be passed through your urethra into your bladder. Germ-free (sterile) fluid will flow through the cystoscope to fill your bladder. The fluid will stretch your bladder so that your health care provider can clearly examine your bladder walls. Your doctor will look at the urethra and bladder. The cystoscope will be removed The procedure may vary among health care providers  What can I expect after the procedure? After the procedure, it is common to have: Some soreness or pain in your urethra. Urinary symptoms. These include: Mild pain or burning when you urinate. Pain should stop within a few minutes after you urinate. This may last for up to a few days after the  procedure. A small amount of blood in your urine for several days. Feeling like you need to urinate but producing only a small amount of urine. Follow these instructions at home: General instructions Return to your normal activities as told by your health care provider.  Drink plenty of fluids after the procedure. Keep all follow-up visits as told by your health care provider. This is important. Contact a health care provider if you: Have pain that gets worse or does not get better with medicine, especially pain when you urinate lasting longer than 72 hours after the procedure. Have trouble urinating. Get help right away if you: Have blood clots in your urine. Have a fever or chills. Are unable to urinate. Summary Cystoscopy is a procedure that is used to help diagnose and sometimes treat conditions that affect the lower urinary tract. Cystoscopy is done using a thin, tube-shaped instrument with a light and camera at the end. After the procedure, it is common to have some soreness or pain in your urethra. It is normal to have blood in your urine after the procedure.  If you were prescribed an antibiotic medicine, take it as told by your health care provider.  This information is not intended to replace advice given to you by your health care provider. Make sure you discuss any questions you have with your health care provider. Document Revised: 09/06/2018 Document Reviewed: 09/06/2018 Elsevier Patient Education  2020 Elsevier Inc.   Transrectal Ultrasound  A transrectal ultrasound is a procedure that uses sound waves to create images of the prostate gland and nearby tissues. For this procedure, an ultrasound probe is placed   in the rectum. The probe sends sound waves through the wall of the rectum into the prostate gland. The prostate is a walnut-sized gland that is located below the bladder and in front of the rectum. The images show the size and shape of the prostate gland and nearby  structures. You may need this test if you have: Trouble urinating. Trouble getting your partner pregnant (infertility). An abnormal result from a prostate screening exam. Tell a health care provider about: Any allergies you have. All medicines you are taking, including vitamins, herbs, eye drops, creams, and over-the-counter medicines. Any bleeding problems you have. Any surgeries you have had. Any medical conditions you have. Any prostate infections you have had. What are the risks? Generally, this is a safe procedure. However, problems may occur, including: Discomfort during the procedure. Blood in your urine or sperm after the procedure. This may occur if a sample of tissue is taken to look at under a microscope (biopsy) during the procedure. What happens before the procedure? Your health care provider may instruct you to use an enema 1-4 hours before the procedure. Follow instructions from your health care provider about how to do the enema. Ask your health care provider about: Changing or stopping your regular medicines. This is especially important if you are taking diabetes medicines or blood thinners. Taking medicines such as aspirin and ibuprofen. These medicines can thin your blood. Do not take these medicines unless your health care provider tells you to take them. Taking over-the-counter medicines, vitamins, herbs, and supplements. What happens during the procedure? You will be asked to lie down on your left side on an exam table. You will bend your knees toward your chest. Gel will be put on a small probe that is about the width of a finger. The probe will be gently inserted into your rectum. You may have a feeling of fullness but should not feel pain. The probe will send signals to a computer that will create images. These will be displayed on a monitor that looks like a small television screen. The technician will slightly rotate the probe throughout the procedure. While  rotating the probe, he or she will view and capture images of the prostate gland and the surrounding structures from different angles. Your health care provider may take a biopsy sample of prostate tissue during the procedure. The images captured from the ultrasound will help guide the needle that is used to remove a sample of tissue. The sample will be sent to a lab for testing. The probe will be removed. The procedure may vary among health care providers and hospitals. What can I expect after the procedure? It is up to you to get the results of your procedure. Ask your health care provider, or the department that is doing the procedure, when your results will be ready. Keep all follow-up visits. This is important. Summary A transrectal ultrasound is a procedure that uses sound waves to create images of the prostate gland and nearby tissues. The images show the size and shape of the prostate gland and nearby structures. Before the procedure, ask your health care provider about changing or stopping your regular medicines. This is especially important if you are taking diabetes medicines or blood thinners. This information is not intended to replace advice given to you by your health care provider. Make sure you discuss any questions you have with your health care provider. Document Revised: 05/28/2021 Document Reviewed: 03/10/2021 Elsevier Patient Education  2023 Elsevier Inc.  

## 2022-05-27 ENCOUNTER — Ambulatory Visit (INDEPENDENT_AMBULATORY_CARE_PROVIDER_SITE_OTHER): Payer: Medicare Other | Admitting: Urology

## 2022-05-27 DIAGNOSIS — N401 Enlarged prostate with lower urinary tract symptoms: Secondary | ICD-10-CM | POA: Diagnosis not present

## 2022-05-27 DIAGNOSIS — R351 Nocturia: Secondary | ICD-10-CM

## 2022-05-27 MED ORDER — TAMSULOSIN HCL 0.4 MG PO CAPS: 0.4000 mg | ORAL_CAPSULE | Freq: Every day | ORAL | 11 refills | Status: DC

## 2022-05-27 NOTE — Progress Notes (Signed)
05/27/22  Chief Complaint  Patient presents with   Cysto    TRUS     HPI: 66 year old male with refractory urinary symptoms related to BPH who presents today to the office for cystoscopy and prostate sizing   Please see previous notes for details.     There were no vitals taken for this visit. NED. A&Ox3.   No respiratory distress   Abd soft, NT, ND Normal phallus with bilateral descended testicles    Cystoscopy Procedure Note  Patient identification was confirmed, informed consent was obtained, and patient was prepped using Betadine solution.  Lidocaine jelly was administered per urethral meatus.    Preoperative abx where received prior to procedure.     Pre-Procedure: - Inspection reveals a normal caliber ureteral meatus.  Procedure: The flexible cystoscope was introduced without difficulty - No urethral strictures/lesions are present. - Enlarged prostate bilobar coaptation - Normal bladder neck - Bilateral ureteral orifices identified - Bladder mucosa  reveals no ulcers, tumors, or lesions - No bladder stones - No trabeculation  Retroflexion shows unremarkable   Post-Procedure: - Patient tolerated the procedure well   Prostate transrectal ultrasound sizing   Informed consent was obtained after discussing risks/benefits of the procedure.  A time out was performed to ensure correct patient identity.   Pre-Procedure: -Transrectal probe was placed without difficulty -Transrectal Ultrasound performed revealing a 26.2 gm prostate measuring 4.55 x 2.27 x 4.13 cm (length) -No significant hypoechoic or median lobe noted      Assessment/ Plan:  1. BPH associated with nocturia Prostate volume fairly small he does have a relatively tight prostatic fossa  Reassuringly, no evidence of bladder pathology or significant sequela of chronic outlet obstruction  We discussed various options moving forward including optimizing medications.  He does not remember ever  actually trying finasteride although this was documented would like to try this.  We discussed possible side effects of orthostatic hypotension as well as retrograde ejaculation.  We will try this in combination with the daily Cialis.  He understands that medication route would be chronic.  Would not likely benefit from finasteride given the fairly small prostatic volume.  We also discussed consideration of anticholinergic or beta 3 agonist if Flomax is not effective.  Alternatively, we discussed various surgical procedural intervention options including consideration of UroLift or TURP.  He has heard of UroLift and is somewhat interested in this.  We discussed the procedure itself including the perioperative and postoperative course.  We discussed possible complications including failure of the procedure including discussion of overall efficacy rates, clip migration or malfunction, need for future procedures, bleeding, infection, urinary retention amongst others.  All questions were answered.  He will consider UroLift and let us know if he like to move forward with that   Follow-up in 6 months with IPSS/PVR  - Urinalysis, Complete   Hollice Espy, MD

## 2022-07-10 ENCOUNTER — Ambulatory Visit: Admission: RE | Admit: 2022-07-10 | Payer: Medicare Other | Source: Home / Self Care | Admitting: Gastroenterology

## 2022-07-10 ENCOUNTER — Encounter: Admission: RE | Payer: Self-pay | Source: Home / Self Care

## 2022-07-10 SURGERY — COLONOSCOPY WITH PROPOFOL
Anesthesia: General

## 2022-08-21 ENCOUNTER — Telehealth: Payer: Self-pay | Admitting: Family Medicine

## 2022-08-21 NOTE — Telephone Encounter (Signed)
Pt is calling to request the RSV vaccine. Please advise CB- [phone]

## 2022-08-26 NOTE — Telephone Encounter (Signed)
We do not carry the RSV vaccine at the office.   Only the pharmacy has them.   I attempted to call the pt, but I was unable to leave a VM.

## 2022-10-20 DIAGNOSIS — G8929 Other chronic pain: Secondary | ICD-10-CM | POA: Diagnosis not present

## 2022-10-20 DIAGNOSIS — M545 Low back pain, unspecified: Secondary | ICD-10-CM | POA: Diagnosis not present

## 2022-10-20 DIAGNOSIS — M5136 Other intervertebral disc degeneration, lumbar region: Secondary | ICD-10-CM | POA: Diagnosis not present

## 2022-11-05 DIAGNOSIS — H2513 Age-related nuclear cataract, bilateral: Secondary | ICD-10-CM | POA: Diagnosis not present

## 2022-11-24 ENCOUNTER — Ambulatory Visit: Payer: Medicare Other | Admitting: Urology

## 2023-05-04 DIAGNOSIS — H1031 Unspecified acute conjunctivitis, right eye: Secondary | ICD-10-CM | POA: Diagnosis not present

## 2023-05-06 ENCOUNTER — Ambulatory Visit (INDEPENDENT_AMBULATORY_CARE_PROVIDER_SITE_OTHER): Payer: Medicare Other

## 2023-05-06 DIAGNOSIS — Z0000 Encounter for general adult medical examination without abnormal findings: Secondary | ICD-10-CM | POA: Diagnosis not present

## 2023-05-06 DIAGNOSIS — Z1211 Encounter for screening for malignant neoplasm of colon: Secondary | ICD-10-CM

## 2023-05-06 NOTE — Patient Instructions (Addendum)
Jesse Collier , Thank you for taking time to come for your Medicare Wellness Visit. I appreciate your ongoing commitment to your health goals. Please review the following plan we discussed and let me know if I can assist you in the future.   Referrals/Orders/Follow-Ups/Clinician Recommendations: referral for colonoscopy sent  This is a list of the screening recommended for you and due dates:  Health Maintenance  Topic Date Due   Colon Cancer Screening  Never done   Zoster (Shingles) Vaccine (1 of 2) Never done   COVID-19 Vaccine (3 - 2023-24 season) 05/29/2022   Flu Shot  04/29/2023   Pneumonia Vaccine (1 of 2 - PCV) 05/16/2023*   Medicare Annual Wellness Visit  05/05/2024   DTaP/Tdap/Td vaccine (3 - Td or Tdap) 12/22/2029   Hepatitis C Screening  Completed   HPV Vaccine  Aged Out  *Topic was postponed. The date shown is not the original due date.    Advanced directives: (ACP Link)Information on Advanced Care Planning can be found at [facility] Directives Advance Health Care Directives ([email or web address]   Next Medicare Annual Wellness Visit scheduled for next year: Yes    05/11/24 @ 11:15 am by phone  Preventive Care 65 Years and Older, Male  Preventive care refers to lifestyle choices and visits with your health care provider that can promote health and wellness. What does preventive care include? A yearly physical exam. This is also called an annual well check. Dental exams once or twice a year. Routine eye exams. Ask your health care provider how often you should have your eyes checked. Personal lifestyle choices, including: Daily care of your teeth and gums. Regular physical activity. Eating a healthy diet. Avoiding tobacco and drug use. Limiting alcohol use. Practicing safe sex. Taking low doses of aspirin every day. Taking vitamin and mineral supplements as recommended by your health care provider. What happens during an annual well  check? The services and screenings done by your health care provider during your annual well check will depend on your age, overall health, lifestyle risk factors, and family history of disease. Counseling  Your health care provider may ask you questions about your: Alcohol use. Tobacco use. Drug use. Emotional well-being. Home and relationship well-being. Sexual activity. Eating habits. History of falls. Memory and ability to understand (cognition). Work and work Astronomer. Screening  You may have the following tests or measurements: Height, weight, and BMI. Blood pressure. Lipid and cholesterol levels. These may be checked every 5 years, or more frequently if you are over 52 years old. Skin check. Lung cancer screening. You may have this screening every year starting at age 76 if you have a 30-pack-year history of smoking and currently smoke or have quit within the past 15 years. Fecal occult blood test (FOBT) of the stool. You may have this test every year starting at age 43. Flexible sigmoidoscopy or colonoscopy. You may have a sigmoidoscopy every 5 years or a colonoscopy every 10 years starting at age 60. Prostate cancer screening. Recommendations will vary depending on your family history and other risks. Hepatitis C blood test. Hepatitis B blood test. Sexually transmitted disease (STD) testing. Diabetes screening. This is done by checking your blood sugar (glucose) after you have not eaten for a while (fasting). You may have this done every 1-3 years. Abdominal aortic aneurysm (AAA) screening. You may need this if you are a current or former smoker. Osteoporosis. You may be screened starting at age 43 if  you are at high risk. Talk with your health care provider about your test results, treatment options, and if necessary, the need for more tests. Vaccines  Your health care provider may recommend certain vaccines, such as: Influenza vaccine. This is recommended every  year. Tetanus, diphtheria, and acellular pertussis (Tdap, Td) vaccine. You may need a Td booster every 10 years. Zoster vaccine. You may need this after age 85. Pneumococcal 13-valent conjugate (PCV13) vaccine. One dose is recommended after age 75. Pneumococcal polysaccharide (PPSV23) vaccine. One dose is recommended after age 54. Talk to your health care provider about which screenings and vaccines you need and how often you need them. This information is not intended to replace advice given to you by your health care provider. Make sure you discuss any questions you have with your health care provider. Document Released: 10/11/2015 Document Revised: 06/03/2016 Document Reviewed: 07/16/2015 Elsevier Interactive Patient Education  2017 [employer].  Fall Prevention in the Home Falls can cause injuries. They can happen to people of all ages. There are many things you can do to make your home safe and to help prevent falls. What can I do on the outside of my home? Regularly fix the edges of walkways and driveways and fix any cracks. Remove anything that might make you trip as you walk through a door, such as a raised step or threshold. Trim any bushes or trees on the path to your home. Use bright outdoor lighting. Clear any walking paths of anything that might make someone trip, such as rocks or tools. Regularly check to see if handrails are loose or broken. Make sure that both sides of any steps have handrails. Any raised decks and porches should have guardrails on the edges. Have any leaves, snow, or ice cleared regularly. Use sand or salt on walking paths during winter. Clean up any spills in your garage right away. This includes oil or grease spills. What can I do in the bathroom? Use night lights. Install grab bars by the toilet and in the tub and shower. Do not use towel bars as grab bars. Use non-skid mats or decals in the tub or shower. If you need to sit down in the shower, use a  plastic, non-slip stool. Keep the floor dry. Clean up any water that spills on the floor as soon as it happens. Remove soap buildup in the tub or shower regularly. Attach bath mats securely with double-sided non-slip rug tape. Do not have throw rugs and other things on the floor that can make you trip. What can I do in the bedroom? Use night lights. Make sure that you have a light by your bed that is easy to reach. Do not use any sheets or blankets that are too big for your bed. They should not hang down onto the floor. Have a firm chair that has side arms. You can use this for support while you get dressed. Do not have throw rugs and other things on the floor that can make you trip. What can I do in the kitchen? Clean up any spills right away. Avoid walking on wet floors. Keep items that you use a lot in easy-to-reach places. If you need to reach something above you, use a strong step stool that has a grab bar. Keep electrical cords out of the way. Do not use floor polish or wax that makes floors slippery. If you must use wax, use non-skid floor wax. Do not have throw rugs and other things on  the floor that can make you trip. What can I do with my stairs? Do not leave any items on the stairs. Make sure that there are handrails on both sides of the stairs and use them. Fix handrails that are broken or loose. Make sure that handrails are as long as the stairways. Check any carpeting to make sure that it is firmly attached to the stairs. Fix any carpet that is loose or worn. Avoid having throw rugs at the top or bottom of the stairs. If you do have throw rugs, attach them to the floor with carpet tape. Make sure that you have a light switch at the top of the stairs and the bottom of the stairs. If you do not have them, ask someone to add them for you. What else can I do to help prevent falls? Wear shoes that: Do not have high heels. Have rubber bottoms. Are comfortable and fit you  well. Are closed at the toe. Do not wear sandals. If you use a stepladder: Make sure that it is fully opened. Do not climb a closed stepladder. Make sure that both sides of the stepladder are locked into place. Ask someone to hold it for you, if possible. Clearly mark and make sure that you can see: Any grab bars or handrails. First and last steps. Where the edge of each step is. Use tools that help you move around (mobility aids) if they are needed. These include: Canes. Walkers. Scooters. Crutches. Turn on the lights when you go into a dark area. Replace any light bulbs as soon as they burn out. Set up your furniture so you have a clear path. Avoid moving your furniture around. If any of your floors are uneven, fix them. If there are any pets around you, be aware of where they are. Review your medicines with your doctor. Some medicines can make you feel dizzy. This can increase your chance of falling. Ask your doctor what other things that you can do to help prevent falls. This information is not intended to replace advice given to you by your health care provider. Make sure you discuss any questions you have with your health care provider. Document Released: 07/11/2009 Document Revised: 02/20/2016 Document Reviewed: 10/19/2014 Elsevier Interactive Patient Education  2017 [employer].

## 2023-05-06 NOTE — Progress Notes (Signed)
Subjective:   Jesse Collier is a 67 y.o. male who presents for Medicare Annual/Subsequent preventive examination.  Visit Complete: Virtual  I connected with  Jesse Collier on 05/06/23 by a audio enabled telemedicine application and verified that I am speaking with the correct person using two identifiers.  Patient Location: Home  Provider Location: Office/Clinic  I discussed the limitations of evaluation and management by telemedicine. The patient expressed understanding and agreed to proceed.  Vital Signs: Unable to obtain new vitals due to this being a telehealth visit.   Review of Systems     Cardiac Risk Factors include: advanced age (>56men, >88 women);male gender     Objective:    There were no vitals filed for this visit. There is no height or weight on file to calculate BMI.     05/06/2023    3:01 PM 02/06/2022    3:00 PM 11/13/2021    8:12 AM  Advanced Directives  Does Patient Have a Medical Advance Directive? No No No  Would patient like information on creating a medical advance directive? No - Patient declined No - Patient declined     Current Medications (verified) Outpatient Encounter Medications as of 05/06/2023  Medication Sig   ibuprofen (ADVIL) 100 MG/5ML suspension Take by mouth.   psyllium (KONSYL) 33 % POWD Take by mouth.   SHINGRIX injection Inject 0.5 mL into muscle for shingles vaccine. Repeat dose in 2-6 months.   tadalafil (CIALIS) 5 MG tablet Take 1 tablet (5 mg total) by mouth daily.   tamsulosin (FLOMAX) 0.4 MG CAPS capsule Take 1 capsule (0.4 mg total) by mouth daily.   No facility-administered encounter medications on file as of 05/06/2023.    Allergies (verified) Gabapentin   History: Past Medical History:  Diagnosis Date   Arthritis    BPH associated with nocturia 02/03/2021   DJD (degenerative joint disease) 02/03/2021   Neuropathy 08/28/2021   Numbness 08/28/2021   Pure hypercholesterolemia 03/05/2021   Past Surgical History:   Procedure Laterality Date   BOWEL RESECTION  2012   swallowed toothpick   CARPAL TUNNEL RELEASE Left    COLONOSCOPY  11/2013   ulcer of terminal ileum   KNEE ARTHROSCOPY Right    MEDIAL PARTIAL KNEE REPLACEMENT Right    Family History  Problem Relation Age of Onset   CAD Mother    Colon cancer Neg Hx    Prostate cancer Neg Hx    Bladder Cancer Neg Hx    Kidney cancer Neg Hx    Social History   Socioeconomic History   Marital status: Married    Spouse name: Not on file   Number of children: Not on file   Years of education: Not on file   Highest education level: Not on file  Occupational History   Not on file  Tobacco Use   Smoking status: Some Days    Current packs/day: 0.00    Types: Cigarettes, E-cigarettes    Start date: 05/16/2021    Last attempt to quit: 05/23/2021    Years since quitting: 1.9   Smokeless tobacco: Never   Tobacco comments:    1 to 1.5 ppd 30-40 years.  Substance and Sexual Activity   Alcohol use: No    Alcohol/week: 0.0 standard drinks of alcohol   Drug use: No   Sexual activity: Not on file  Other Topics Concern   Not on file  Social History Narrative   Not on file   Social Determinants of Health  Financial Resource Strain: Low Risk  (05/06/2023)   Overall Financial Resource Strain (CARDIA)    Difficulty of Paying Living Expenses: Not hard at all  Food Insecurity: No Food Insecurity (05/06/2023)   Hunger Vital Sign    Worried About Running Out of Food in the Last Year: Never true    Ran Out of Food in the Last Year: Never true  Transportation Needs: No Transportation Needs (05/06/2023)   PRAPARE - Administrator, Civil Service (Medical): No    Lack of Transportation (Non-Medical): No  Physical Activity: Sufficiently Active (05/06/2023)   Exercise Vital Sign    Days of Exercise per Week: 6 days    Minutes of Exercise per Session: 30 min  Stress: No Stress Concern Present (05/06/2023)   [employer] of Occupational Health -  Occupational Stress Questionnaire    Feeling of Stress : Not at all  Social Connections: Moderately Isolated (05/06/2023)   Social Connection and Isolation Panel [NHANES]    Frequency of Communication with Friends and Family: More than three times a week    Frequency of Social Gatherings with Friends and Family: Once a week    Attends Religious Services: Never    Database administrator or Organizations: No    Attends Engineer, structural: Never    Marital Status: Married    Tobacco Counseling Ready to quit: Not Answered Counseling given: Not Answered Tobacco comments: 1 to 1.5 ppd 30-40 years.   Clinical Intake:  Pre-visit preparation completed: Yes  Pain : No/denies pain     Nutritional Risks: None Diabetes: No  How often do you need to have someone help you when you read instructions, pamphlets, or other written materials from your doctor or pharmacy?: 1 - Never  Interpreter Needed?: No  Information entered by :: Kennedy Bucker, LPN   Activities of Daily Living    05/06/2023    3:02 PM 05/02/2023   11:58 AM  In your present state of health, do you have any difficulty performing the following activities:  Hearing? 0 0  Vision? 0 0  Difficulty concentrating or making decisions? 0 0  Walking or climbing stairs? 0 0  Dressing or bathing? 0 0  Doing errands, shopping? 0 0  Preparing Food and eating ? N N  Using the Toilet? N N  In the past six months, have you accidently leaked urine? N N  Do you have problems with loss of bowel control? N N  Managing your Medications? N N  Managing your Finances? N N  Housekeeping or managing your Housekeeping? N N    Patient Care Team: Smitty Cords, DO as PCP - General (Family Medicine)  Indicate any recent Medical Services you may have received from other than [facility] providers in the past year (date may be approximate).     Assessment:   This is a routine wellness examination for Jesse Collier.  Hearing/Vision  screen Hearing Screening - Comments:: No aids Vision Screening - Comments:: Wears glasses- Dr.Woodard  Dietary issues and exercise activities discussed:     Goals Addressed             This Visit's Progress    DIET - INCREASE WATER INTAKE         Depression Screen    05/06/2023    3:00 PM 05/15/2022    8:21 AM 02/06/2022    2:58 PM 04/08/2021    2:34 PM 03/05/2021    2:03 PM 02/03/2021   10:06 AM  PHQ 2/9 Scores  PHQ - 2 Score 0 5 0 0 0 0  PHQ- 9 Score 0 10  0 0 1    Fall Risk    05/06/2023    3:02 PM 05/02/2023   11:58 AM 05/15/2022    8:21 AM 02/06/2022    3:01 PM 04/08/2021    2:34 PM  Fall Risk   Falls in the past year? 0 0 0 0 0  Number falls in past yr: 0 0 0 0 0  Injury with Fall? 0 0 0 0 0  Risk for fall due to : No Fall Risks  No Fall Risks No Fall Risks   Follow up Falls prevention discussed;Falls evaluation completed  Falls evaluation completed Falls evaluation completed Falls evaluation completed    MEDICARE RISK AT HOME:  Medicare Risk at Home - 05/06/23 1502     Any stairs in or around the home? Yes    If so, are there any without handrails? No    Home free of loose throw rugs in walkways, pet beds, electrical cords, etc? No    Adequate lighting in your home to reduce risk of falls? Yes    Life alert? No    Use of a cane, walker or w/c? No    Grab bars in the bathroom? Yes    Shower chair or bench in shower? No    Elevated toilet seat or a handicapped toilet? No             TIMED UP AND GO:  Was the test performed?  No    Cognitive Function:        05/06/2023    3:03 PM 02/06/2022    3:02 PM  6CIT Screen  What Year? 0 points 0 points  What month? 0 points 0 points  What time? 0 points 0 points  Count back from 20 0 points 0 points  Months in reverse 0 points 0 points  Repeat phrase 0 points 2 points  Total Score 0 points 2 points    Immunizations Immunization History  Administered Date(s) Administered   PFIZER(Purple Top)SARS-COV-2  Vaccination 01/06/2020, 01/31/2020   Tdap 09/29/2010, 12/23/2019    TDAP status: Up to date  Flu Vaccine status: Declined, Education has been provided regarding the importance of this vaccine but patient still declined. Advised may receive this vaccine at local pharmacy or Health Dept. Aware to provide a copy of the vaccination record if obtained from local pharmacy or Health Dept. Verbalized acceptance and understanding.  Pneumococcal vaccine status: Declined,  Education has been provided regarding the importance of this vaccine but patient still declined. Advised may receive this vaccine at local pharmacy or Health Dept. Aware to provide a copy of the vaccination record if obtained from local pharmacy or Health Dept. Verbalized acceptance and understanding.   Covid-19 vaccine status: Completed vaccines  Qualifies for Shingles Vaccine? Yes   Zostavax completed No   Shingrix Completed?: No.    Education has been provided regarding the importance of this vaccine. Patient has been advised to call insurance company to determine out of pocket expense if they have not yet received this vaccine. Advised may also receive vaccine at local pharmacy or Health Dept. Verbalized acceptance and understanding.  Screening Tests Health Maintenance  Topic Date Due   Colonoscopy  Never done   Zoster Vaccines- Shingrix (1 of 2) Never done   COVID-19 Vaccine (3 - 2023-24 season) 05/29/2022   INFLUENZA VACCINE  04/29/2023   Pneumonia  Vaccine 51+ Years old (1 of 2 - PCV) 05/16/2023 (Originally 02/08/1962)   Medicare Annual Wellness (AWV)  05/05/2024   DTaP/Tdap/Td (3 - Td or Tdap) 12/22/2029   Hepatitis C Screening  Completed   HPV VACCINES  Aged Out    Health Maintenance  Health Maintenance Due  Topic Date Due   Colonoscopy  Never done   Zoster Vaccines- Shingrix (1 of 2) Never done   COVID-19 Vaccine (3 - 2023-24 season) 05/29/2022   INFLUENZA VACCINE  04/29/2023    Colorectal cancer screening:  Referral to GI placed 05/06/23. Pt aware the office will call re: appt.  Lung Cancer Screening: (Low Dose CT Chest recommended if Age 59-80 years, 20 pack-year currently smoking OR have quit w/in 15years.) does not qualify.    Additional Screening:  Hepatitis C Screening: does qualify; Completed 02/26/21  Vision Screening: Recommended annual ophthalmology exams for early detection of glaucoma and other disorders of the eye. Is the patient up to date with their annual eye exam?  Yes  Who is the provider or what is the name of the office in which the patient attends annual eye exams? Dr.Woodard If pt is not established with a provider, would they like to be referred to a provider to establish care? No .   Dental Screening: Recommended annual dental exams for proper oral hygiene  Community Resource Referral / Chronic Care Management: CRR required this visit?  No   CCM required this visit?  No     Plan:     I have personally reviewed and noted the following in the patient's chart:   Medical and social history Use of alcohol, tobacco or illicit drugs  Current medications and supplements including opioid prescriptions. Patient is not currently taking opioid prescriptions. Functional ability and status Nutritional status Physical activity Advanced directives List of other physicians Hospitalizations, surgeries, and ER visits in previous 12 months Vitals Screenings to include cognitive, depression, and falls Referrals and appointments  In addition, I have reviewed and discussed with patient certain preventive protocols, quality metrics, and best practice recommendations. A written personalized care plan for preventive services as well as general preventive health recommendations were provided to patient.     Hal Hope, LPN   10/03/1094   After Visit Summary: (MyChart) Due to this being a telephonic visit, the after visit summary with patients personalized plan was offered to  patient via MyChart   Nurse Notes: none

## 2023-05-07 ENCOUNTER — Telehealth: Payer: Self-pay

## 2023-05-07 ENCOUNTER — Other Ambulatory Visit: Payer: Self-pay

## 2023-05-07 DIAGNOSIS — Z1211 Encounter for screening for malignant neoplasm of colon: Secondary | ICD-10-CM

## 2023-05-07 MED ORDER — NA SULFATE-K SULFATE-MG SULF 17.5-3.13-1.6 GM/177ML PO SOLN: 1.0000 | Freq: Once | ORAL | 0 refills | Status: AC

## 2023-05-07 NOTE — Telephone Encounter (Signed)
Gastroenterology Pre-Procedure Review  Request Date: 06/24/23 Requesting Physician: Dr. Tobi Bastos  PATIENT REVIEW QUESTIONS: The patient responded to the following health history questions as indicated:    1. Are you having any GI issues? no 2. Do you have a personal history of Polyps? no 3. Do you have a family history of Colon Cancer or Polyps? no 4. Diabetes Mellitus? no 5. Joint replacements in the past 12 months?no 6. Major health problems in the past 3 months?no 7. Any artificial heart valves, MVP, or defibrillator?no    MEDICATIONS & ALLERGIES:    Patient reports the following regarding taking any anticoagulation/antiplatelet therapy:   Plavix, Coumadin, Eliquis, Xarelto, Lovenox, Pradaxa, Brilinta, or Effient? no Aspirin? no  Patient confirms/reports the following medications:  Current Outpatient Medications  Medication Sig Dispense Refill   ibuprofen (ADVIL) 100 MG/5ML suspension Take by mouth.     psyllium (KONSYL) 33 % POWD Take by mouth.     SHINGRIX injection Inject 0.5 mL into muscle for shingles vaccine. Repeat dose in 2-6 months. 0.5 mL 1   tadalafil (CIALIS) 5 MG tablet Take 1 tablet (5 mg total) by mouth daily. 30 tablet 2   tamsulosin (FLOMAX) 0.4 MG CAPS capsule Take 1 capsule (0.4 mg total) by mouth daily. 30 capsule 11   No current facility-administered medications for this visit.    Patient confirms/reports the following allergies:  Allergies  Allergen Reactions   Gabapentin     Severe confusion     No orders of the defined types were placed in this encounter.   AUTHORIZATION INFORMATION Primary Insurance: 1D#: Group #:  Secondary Insurance: 1D#: Group #:  SCHEDULE INFORMATION: Date: 06/24/23 Time: Location: [facility]

## 2023-06-02 ENCOUNTER — Telehealth: Payer: Self-pay | Admitting: Gastroenterology

## 2023-06-02 NOTE — Telephone Encounter (Signed)
Patient called in to schedule his procedure, patient is going out of town.

## 2023-06-15 IMAGING — DX DG CHEST 2V
2 series · 2 of 2 positions shown · non-contrast
Comparison: 04/22/2012

CLINICAL DATA: 6 week history of cough.

EXAM:
CHEST - 2 VIEW

[chest pa]
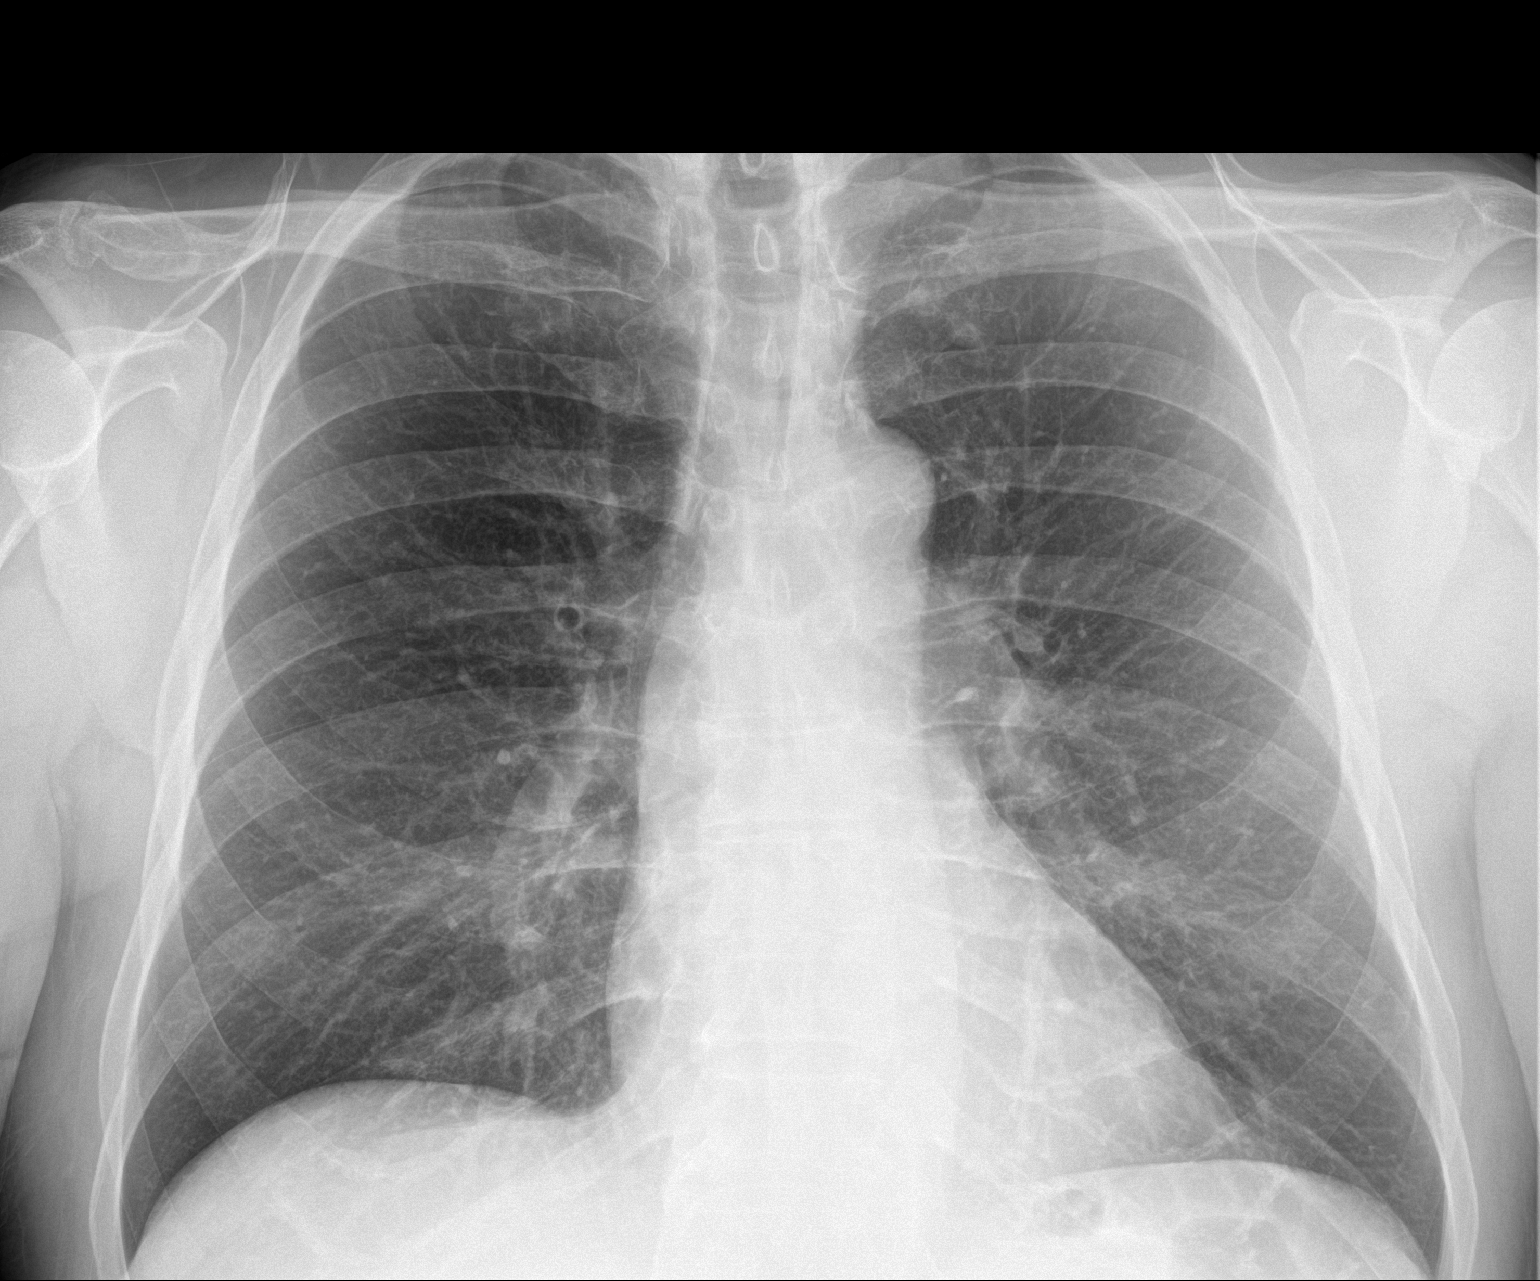

[chest lat]
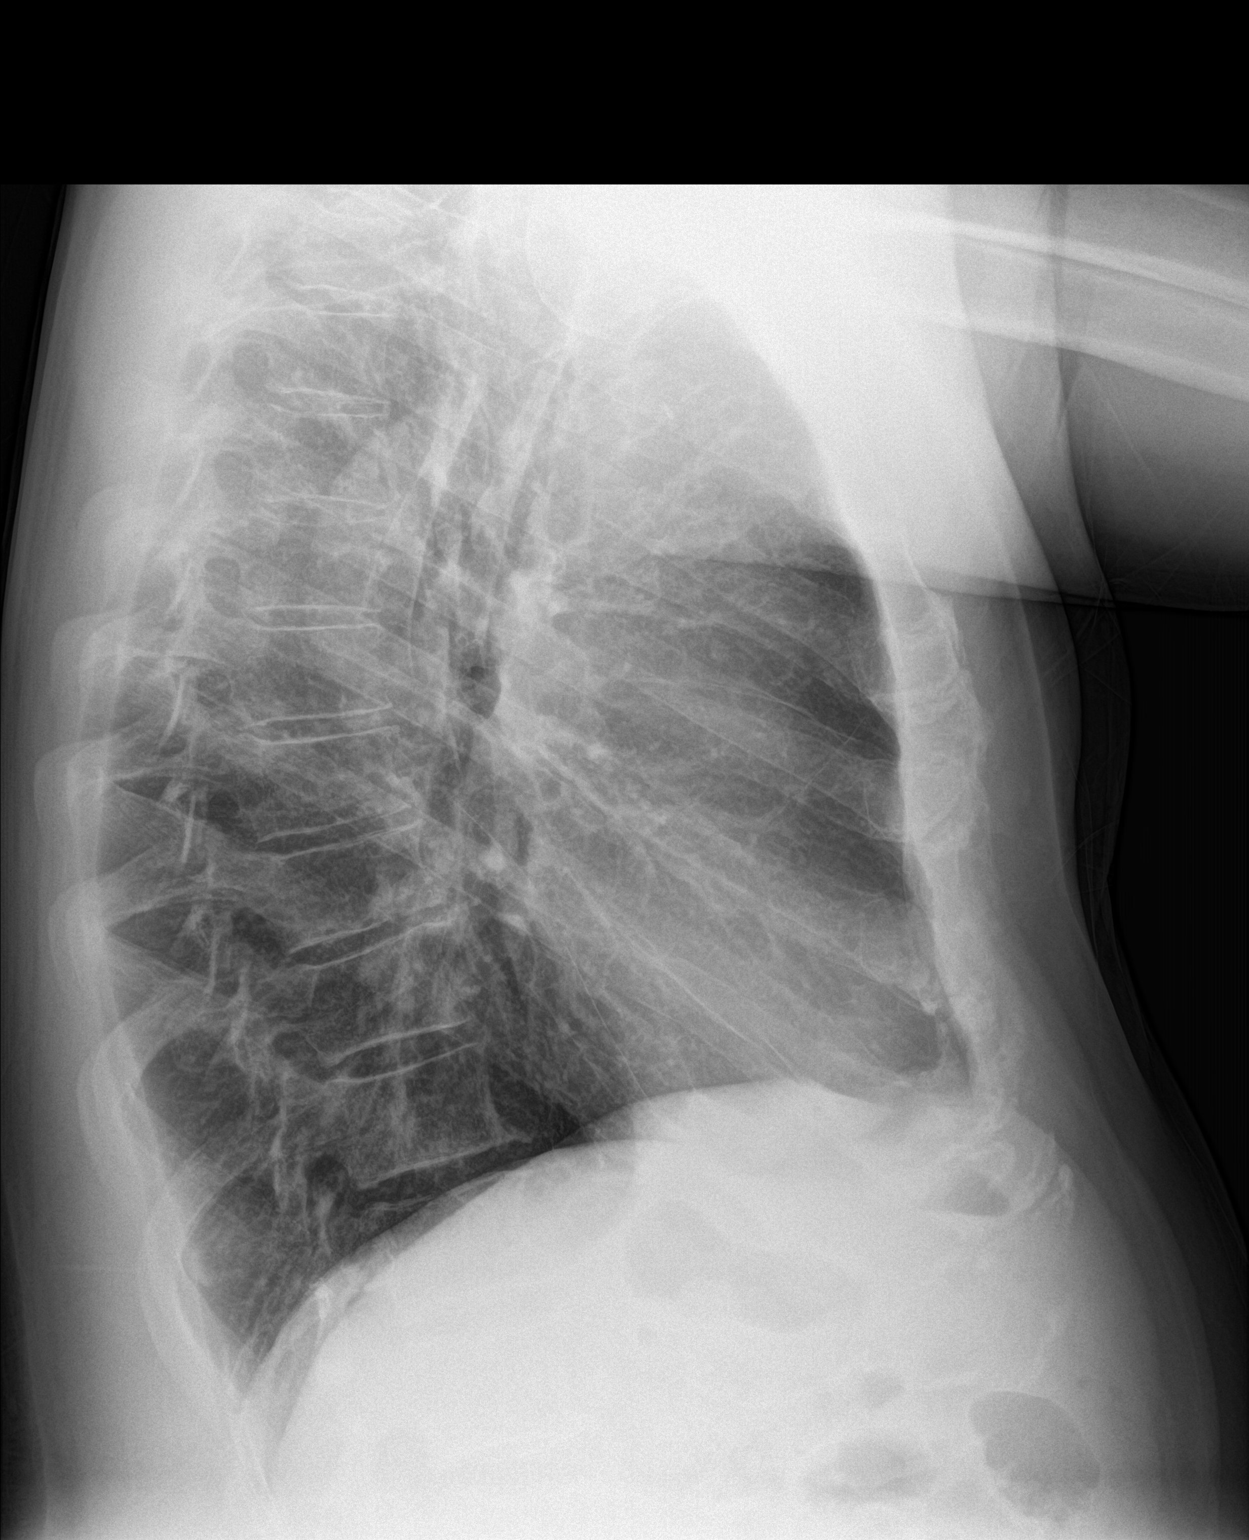

[2 of 2 positions shown; findings below may reference images not displayed]

FINDINGS: The lungs are clear without focal pneumonia, edema, pneumothorax or
pleural effusion. The cardiopericardial silhouette is within normal
limits for size. The visualized bony structures of the thorax show
no acute abnormality.
IMPRESSION: No active cardiopulmonary disease.

## 2023-06-24 ENCOUNTER — Ambulatory Visit: Admit: 2023-06-24 | Payer: Medicare Other | Admitting: Gastroenterology

## 2023-06-24 SURGERY — COLONOSCOPY WITH PROPOFOL
Anesthesia: General

## 2023-08-25 ENCOUNTER — Encounter: Payer: Self-pay | Admitting: Family Medicine

## 2023-08-25 ENCOUNTER — Ambulatory Visit (INDEPENDENT_AMBULATORY_CARE_PROVIDER_SITE_OTHER): Payer: Medicare Other | Admitting: Family Medicine

## 2023-08-25 VITALS — BP 120/64 | Ht 71.0 in | Wt 203.8 lb

## 2023-08-25 DIAGNOSIS — F5104 Psychophysiologic insomnia: Secondary | ICD-10-CM

## 2023-08-25 DIAGNOSIS — G2581 Restless legs syndrome: Secondary | ICD-10-CM | POA: Diagnosis not present

## 2023-08-25 MED ORDER — ROPINIROLE HCL 0.5 MG PO TABS
0.5000 mg | ORAL_TABLET | Freq: Every day | ORAL | 2 refills | Status: DC
Start: 2023-08-25 — End: 2023-10-07

## 2023-08-25 MED ORDER — TRAZODONE HCL 50 MG PO TABS: 50.0000 mg | ORAL_TABLET | Freq: Every day | ORAL | 2 refills | Status: DC

## 2023-08-25 NOTE — Patient Instructions (Addendum)
Thank you for coming to the office today.  Start the medications as prescribed  Trazodone 50mg  nightly 30 min before bed, can be taken intermittently, can dose increase if we need  After 1-2 weeks, okay to add the Restless Leg medicine Ropinirole 0.5mg  start one nightly and then can go up to 2, and we can re order higher dose if need.  Next time consider day-time anxiety med if needed. Or possibly ADHD treatment.   Sleep Hygiene Recommendations to promote healthy sleep in all patients, especially if symptoms of insomnia are worsening. Due to the nature of sleep rhythms, if your body gets "out of rhythm", it may take some time before your sleep cycle can be "reset".  Please try to follow as many of the following tips as you can, usually there are only a few of these are the primary cause of the problem.  ?To reset your sleep rhythm, go to bed and get up at the same time every day ?Sleep only long enough to feel rested and then get out of bed ?Do not try to force yourself to sleep. If you can't sleep, get out of bed and try again later. ?Avoid naps during the day, unless excessively tired. The more sleeping during the day, then the less sleep your body needs at night.  ?Have coffee, tea, and other foods that have caffeine only in the morning ?Exercise several days a week, but not right before bed ?If you drink alcohol, prefer to have appropriate drink with one meal, but prefer to avoid alcohol in the evening, and bedtime ?If you smoke, avoid smoking, especially in the evening  ?Avoid watching TV or looking at phones, computers, or reading devices ("e-books") that give off light at least 30 minutes before bed. This artificial light sends "awake signals" to your brain and can make it harder to fall asleep. ?Make your bedroom a comfortable place where it is easy to fall asleep: Put up shades or special blackout curtains to block light from outside. Use a white noise machine to block  noise. Keep the temperature cool. ?Try your best to solve or at least address your problems before you go to bed ?Use relaxation techniques to manage stress. Ask your health care provider to suggest some techniques that may work well for you. These may include: Breathing exercises. Routines to release muscle tension. Visualizing peaceful scenes.     Please schedule a Follow-up Appointment to: Return for Follow-up for Insomnia/RLS, Lab results .  If you have any other questions or concerns, please feel free to call the office or send a message through MyChart. You may also schedule an earlier appointment if necessary.  Additionally, you may be receiving a survey about your experience at our office within a few days to 1 week by e-mail or mail. We value your feedback.  Saralyn Pilar, DO [facility] [place]

## 2023-08-25 NOTE — Progress Notes (Unsigned)
Subjective:    Patient ID: Jesse Collier, male    DOB: 1956-09-26, 67 y.o.   MRN: [id number]  Jesse Collier is a 67 y.o. male presenting on 08/25/2023 for Medical Management of Chronic Issues   HPI  Discussed the use of AI scribe software for clinical note transcription with the patient, who gave verbal consent to proceed.  History of Present Illness          Anxiety Insomnia History as a child 64-9 yr old had epileptic seizure, was on dilantin for a year. Also he reports possible multiple concussions over the years playing sports and other issues Admits mind racing and feels He admits frustration with this, sleepy tired during the day due to poor sleep He has tried sleep hygiene 3rd eye meditation Tried OTC melatonin without success, and had groggy side effect  *** He did a sleep study [facility] at [facility] several years ago in [place], negative for sleep apnea. - He consumes caffeine 1-2 cups in AM only, none after 2pm - Prior history diagnosed with ADD   Osteoarthritis multiple joints Neuropathy, sensory He has had orthopedic consultation, was not diagnosed w/ Rheumatoid arthritis. Also seen [facility] in [place]. He has seen Ortho in [facility] - Dr Luiz Blare 5 years ago R partial knee replacement.   He has daily arthritis joint pain episodic No significant debilitation but he is in pain daily He is able to function working part time   He is prescribed Celebrex 100mg  BID. He takes most often. He does swap for Advil 400mg  in future PRN, if he takes one he does not take the other. - Tried OTC Blue Emu, [place] Cream, Voltaren topical, Tylenol. - Locations include feet, knees, hands - In AM hands are stiff and have some stiffness with movement - He has worked as Merchandiser, retail for years, prolong standing, not as much lifting - If not work he is staying active with golf, yard work Catering manager. - Admits mild tingling in bottoms of feet, very mild. Not painful.    Last visit 02/03/21,  For arthritis, he was prescribed and he took Gabapentin x 3 days, with 100mg  dose only for 3 days. He felt confusion and altered mental status was sent to hospital ED for evaluation. Discontinued Gabapentin and his symptoms resolved.   Today he is doing well no new concerns on arthritis.   History of Stomach Bug / Virus few months ago He had nausea vomiting diarrhea, GI illness. He lost 12-15 lbs. Now he has overhauled the diet and improved, no longer snacking , less junk foods. More veggies, fruits  ----------------------------   HYPERLIPIDEMIA: - Reports no concerns. Last lipid panel 04/2022, mostly normal. Not on rx medication   L Carotid bruit    BPH Nocturia Chronic problem with some BPH symptoms mostly with nocturia waking up few times to void. Not impacting him much during day. Has failed tamsulosin or other alpha blocker made him sedated or groggy. - He also has chronic Erectile Dysfunction ED for years, tried most options including viagra - Interested to try Cialis and talk with Urologist.    Fam history shingles - brother had it. He has had chicken pox. Not had shingles vaccine.   Health Maintenance:   PSA 0.55 (02/2021) negative. No fam history of prostate.   Colon CA screening - Previous Colonoscopy due to emergency issue 2010, now 12 years later due for repeat, asymptomatic. He has cologuard kit we ordered last time, has not been ready  to complete it yet.   Health Maintenance: ***     08/25/2023    1:32 PM 05/06/2023    3:00 PM 05/15/2022    8:21 AM  Depression screen PHQ 2/9  Decreased Interest 1 0 3  Down, Depressed, Hopeless 3 0 2  PHQ - 2 Score 4 0 5  Altered sleeping 1 0 3  Tired, decreased energy 1 0 0  Change in appetite 1 0 1  Feeling bad or failure about yourself  1 0 1  Trouble concentrating 1 0 0  Moving slowly or fidgety/restless 3 0 0  Suicidal thoughts 0 0 0  PHQ-9 Score 12 0 10  Difficult doing work/chores Somewhat  difficult Not difficult at all Not difficult at all       08/25/2023    1:32 PM 05/15/2022    8:22 AM 04/08/2021    2:35 PM 03/05/2021    2:03 PM  GAD 7 : Generalized Anxiety Score  Nervous, Anxious, on Edge 3 0 0 0  Control/stop worrying 3 1 0 0  Worry too much - different things 3 0 0 0  Trouble relaxing 3 1 0 0  Restless 3 2 0 0  Easily annoyed or irritable 1 1 0 0  Afraid - awful might happen 3 0 0 0  Total GAD 7 Score 19 5 0 0  Anxiety Difficulty Somewhat difficult Not difficult at all Not difficult at all Not difficult at all    Social History   Tobacco Use   Smoking status: Some Days    Current packs/day: 0.00    Types: Cigarettes, E-cigarettes    Start date: 05/16/2021    Last attempt to quit: 05/23/2021    Years since quitting: 2.2   Smokeless tobacco: Never   Tobacco comments:    1 to 1.5 ppd 30-40 years.  Substance Use Topics   Alcohol use: No    Alcohol/week: 0.0 standard drinks of alcohol   Drug use: No    Review of Systems Per HPI unless specifically indicated above     Objective:    BP 120/64   Ht 5\' 11"  (1.803 m)   Wt 203 lb 12.8 oz (92.4 kg)   BMI 28.42 kg/m   Wt Readings from Last 3 Encounters:  08/25/23 203 lb 12.8 oz (92.4 kg)  05/22/22 179 lb (81.2 kg)  05/15/22 180 lb 9.6 oz (81.9 kg)    Physical Exam  Results for orders placed or performed during the hospital encounter of 05/22/22  Urinalysis, Complete w Microscopic  Result Value Ref Range   Color, Urine YELLOW YELLOW   APPearance CLEAR CLEAR   Specific Gravity, Urine 1.020 1.005 - 1.030   pH 5.5 5.0 - 8.0   Glucose, UA NEGATIVE NEGATIVE mg/dL   Hgb urine dipstick NEGATIVE NEGATIVE   Bilirubin Urine NEGATIVE NEGATIVE   Ketones, ur NEGATIVE NEGATIVE mg/dL   Protein, ur NEGATIVE NEGATIVE mg/dL   Nitrite NEGATIVE NEGATIVE   Leukocytes,Ua NEGATIVE NEGATIVE   Squamous Epithelial / HPF NONE SEEN 0 - 5   WBC, UA NONE SEEN 0 - 5 WBC/hpf   RBC / HPF NONE SEEN 0 - 5 RBC/hpf   Bacteria,  UA RARE (A) NONE SEEN      Assessment & Plan:   Problem List Items Addressed This Visit   None Visit Diagnoses     Psychophysiological insomnia    -  Primary   Relevant Medications   traZODone (DESYREL) 50 MG tablet   Restless  leg syndrome       Relevant Medications   rOPINIRole (REQUIP) 0.5 MG tablet        Assessment and Plan              No orders of the defined types were placed in this encounter.   Meds ordered this encounter  Medications   traZODone (DESYREL) 50 MG tablet    Sig: Take 1 tablet (50 mg total) by mouth at bedtime. For insomnia    Dispense:  30 tablet    Refill:  2   rOPINIRole (REQUIP) 0.5 MG tablet    Sig: Take 1 tablet (0.5 mg total) by mouth at bedtime. May increase dose to 2 tablets = 1mg  nightly if needed. For restless leg syndrome    Dispense:  30 tablet    Refill:  2    Follow up plan: Return for Follow-up for Insomnia/RLS, Lab results .  Future labs ordered for *** all labs  Labs next year 09/2023 *** ***Nurse visit Prevnar-20 same day as the lab.  Saralyn Pilar, DO [facility] 08/25/2023, 1:31 PM

## 2023-08-26 ENCOUNTER — Other Ambulatory Visit: Payer: Self-pay | Admitting: Family Medicine

## 2023-08-26 ENCOUNTER — Encounter: Payer: Self-pay | Admitting: Family Medicine

## 2023-08-26 DIAGNOSIS — N401 Enlarged prostate with lower urinary tract symptoms: Secondary | ICD-10-CM

## 2023-08-26 DIAGNOSIS — F5104 Psychophysiologic insomnia: Secondary | ICD-10-CM

## 2023-08-26 DIAGNOSIS — R7309 Other abnormal glucose: Secondary | ICD-10-CM

## 2023-08-26 DIAGNOSIS — E7800 Pure hypercholesterolemia, unspecified: Secondary | ICD-10-CM

## 2023-08-26 DIAGNOSIS — Z0000 Encounter for general adult medical examination without abnormal findings: Secondary | ICD-10-CM

## 2023-09-07 ENCOUNTER — Ambulatory Visit: Payer: Medicare Other | Admitting: Family Medicine

## 2023-09-30 ENCOUNTER — Other Ambulatory Visit: Payer: Medicare Other

## 2023-09-30 ENCOUNTER — Telehealth: Payer: Self-pay | Admitting: Family Medicine

## 2023-09-30 DIAGNOSIS — Z0000 Encounter for general adult medical examination without abnormal findings: Secondary | ICD-10-CM

## 2023-09-30 DIAGNOSIS — E7800 Pure hypercholesterolemia, unspecified: Secondary | ICD-10-CM | POA: Diagnosis not present

## 2023-09-30 DIAGNOSIS — N401 Enlarged prostate with lower urinary tract symptoms: Secondary | ICD-10-CM

## 2023-09-30 DIAGNOSIS — R7309 Other abnormal glucose: Secondary | ICD-10-CM

## 2023-09-30 DIAGNOSIS — F5104 Psychophysiologic insomnia: Secondary | ICD-10-CM

## 2023-09-30 NOTE — Telephone Encounter (Signed)
 Please call and notify patient  I called [facility] pharmacy. He has 1 refill remaining, and they can fill it early. I gave her permission. He will need to contact the pharmacy or go there to resolve the issue. It may be covered for a vacation fill or he may have to pay out of pocket. They should be able to help him now.  Thank you!  Marsa Officer, DO [facility] 09/30/2023, 11:57 AM

## 2023-09-30 NOTE — Telephone Encounter (Signed)
 Spoke to patient he will get in contact with pharmacy to get the prescription early. Explained he may have to pay out of pocket.

## 2023-09-30 NOTE — Telephone Encounter (Signed)
 Pt is in the office and stated that he will be out of Rx trazodone (DESYREL) 50 MG on January 26 and will be on a cruise when it runs out.  Pharm: [facility] in [place]

## 2023-10-01 LAB — CBC WITH DIFFERENTIAL/PLATELET
Absolute Lymphocytes: 1417 {cells}/uL (ref 850–3900)
Absolute Monocytes: 386 {cells}/uL (ref 200–950)
Basophils Absolute: 28 {cells}/uL (ref 0–200)
Basophils Relative: 0.5 %
Eosinophils Absolute: 151 {cells}/uL (ref 15–500)
Eosinophils Relative: 2.7 %
HCT: 41 % (ref 38.5–50.0)
Hemoglobin: 13.4 g/dL (ref 13.2–17.1)
MCH: 31.2 pg (ref 27.0–33.0)
MCHC: 32.7 g/dL (ref 32.0–36.0)
MCV: 95.6 fL (ref 80.0–100.0)
MPV: 11.1 fL (ref 7.5–12.5)
Monocytes Relative: 6.9 %
Neutro Abs: 3618 {cells}/uL (ref 1500–7800)
Neutrophils Relative %: 64.6 %
Platelets: 190 10*3/uL (ref 140–400)
RBC: 4.29 10*6/uL (ref 4.20–5.80)
RDW: 12.1 % (ref 11.0–15.0)
Total Lymphocyte: 25.3 %
WBC: 5.6 10*3/uL (ref 3.8–10.8)

## 2023-10-01 LAB — LIPID PANEL
Cholesterol: 174 mg/dL (ref <200)
HDL: 65 mg/dL (ref >=40)
LDL Cholesterol (Calc): 96 mg/dL
Non-HDL Cholesterol (Calc): 109 mg/dL (ref <130)
Total CHOL/HDL Ratio: 2.7 (calc) (ref <5.0)
Triglycerides: 51 mg/dL (ref <150)

## 2023-10-01 LAB — COMPLETE METABOLIC PANEL WITH GFR
AG Ratio: 1.9 (calc) (ref 1.0–2.5)
ALT: 14 U/L (ref 9–46)
AST: 22 U/L (ref 10–35)
Albumin: 4.4 g/dL (ref 3.6–5.1)
Alkaline phosphatase (APISO): 53 U/L (ref 35–144)
BUN/Creatinine Ratio: 25 (calc) — ABNORMAL HIGH (ref 6–22)
BUN: 30 mg/dL — ABNORMAL HIGH (ref 7–25)
CO2: 27 mmol/L (ref 20–32)
Calcium: 9.4 mg/dL (ref 8.6–10.3)
Chloride: 107 mmol/L (ref 98–110)
Creat: 1.18 mg/dL (ref 0.70–1.35)
Globulin: 2.3 g/dL (ref 1.9–3.7)
Glucose, Bld: 83 mg/dL (ref 65–99)
Potassium: 5 mmol/L (ref 3.5–5.3)
Sodium: 141 mmol/L (ref 135–146)
Total Bilirubin: 0.4 mg/dL (ref 0.2–1.2)
Total Protein: 6.7 g/dL (ref 6.1–8.1)
eGFR: 68 mL/min/{1.73_m2} (ref >=60)

## 2023-10-01 LAB — TSH: TSH: 1.32 m[IU]/L (ref 0.40–4.50)

## 2023-10-01 LAB — PSA: PSA: 0.63 ng/mL (ref <=4.00)

## 2023-10-01 LAB — HEMOGLOBIN A1C
Hgb A1c MFr Bld: 5.6 %{Hb} (ref <5.7)
Mean Plasma Glucose: 114 mg/dL
eAG (mmol/L): 6.3 mmol/L

## 2023-10-07 ENCOUNTER — Ambulatory Visit (INDEPENDENT_AMBULATORY_CARE_PROVIDER_SITE_OTHER): Payer: Medicare Other | Admitting: Family Medicine

## 2023-10-07 ENCOUNTER — Other Ambulatory Visit: Payer: Self-pay | Admitting: Family Medicine

## 2023-10-07 ENCOUNTER — Encounter: Payer: Self-pay | Admitting: Family Medicine

## 2023-10-07 VITALS — BP 160/58 | HR 68 | Ht 71.0 in | Wt 207.0 lb

## 2023-10-07 DIAGNOSIS — M62830 Muscle spasm of back: Secondary | ICD-10-CM | POA: Diagnosis not present

## 2023-10-07 DIAGNOSIS — Z0000 Encounter for general adult medical examination without abnormal findings: Secondary | ICD-10-CM

## 2023-10-07 DIAGNOSIS — Z23 Encounter for immunization: Secondary | ICD-10-CM | POA: Diagnosis not present

## 2023-10-07 DIAGNOSIS — F5104 Psychophysiologic insomnia: Secondary | ICD-10-CM | POA: Diagnosis not present

## 2023-10-07 DIAGNOSIS — N401 Enlarged prostate with lower urinary tract symptoms: Secondary | ICD-10-CM

## 2023-10-07 DIAGNOSIS — E7800 Pure hypercholesterolemia, unspecified: Secondary | ICD-10-CM

## 2023-10-07 DIAGNOSIS — R7309 Other abnormal glucose: Secondary | ICD-10-CM

## 2023-10-07 MED ORDER — TRAZODONE HCL 50 MG PO TABS: 50.0000 mg | ORAL_TABLET | Freq: Every day | ORAL | 1 refills | Status: DC

## 2023-10-07 MED ORDER — BACLOFEN 10 MG PO TABS: 5.0000 mg | ORAL_TABLET | Freq: Three times a day (TID) | ORAL | 2 refills | Status: AC | PRN

## 2023-10-07 NOTE — Progress Notes (Signed)
 Subjective:    Patient ID: Jesse Collier, male    DOB: 1956-01-13, 68 y.o.   MRN: [id number]  Jesse Collier is a 68 y.o. male presenting on 10/07/2023 for No chief complaint on file.   HPI  Discussed the use of AI scribe software for clinical note transcription with the patient, who gave verbal consent to proceed.  History of Present Illness    RLS Follow-up Chronic problem Previously treated with Ropinirole  He reports a severe adverse reaction to ropinirole , which was prescribed for restless leg syndrome, including depressive thoughts. After a single dose, the medication was discontinued.  He says now RLS symptoms improved after better sleep on new medication Trazodone , see below  Insomnia Anxiety Previous visit 08/25/23, he was initiated on Trazodone  50mg  nightly for insomnia. Prior failed Melatonin The patient also reports that his insomnia has improved with the current medication, noting deeper sleep and feeling more refreshed upon waking. However, he has observed a decrease in sleep duration from approximately seven hours to five hours. Originally the medicine caused him to fall asleep very quickly, now it seems to not have that effect on him, but he still is able to rest better and sleep more soundly.  Chronic Low Back pain Back Spasms The patient also reports a long-standing issue with back pain, which originated from a heavy lifting incident approximately 40 years ago. The pain is sporadic and can shift from one side to the other. It is often triggered by certain movements or positions and can cause brief, intense spasms. The patient has been managing the pain with heat and ice therapy. The patient also mentions a history of working as a merchandiser, retail, which may have contributed to his back issues. Despite the pain, he has been able to modify his activities to avoid provoking the back pain. He expresses hesitance about taking additional medication for the back pain, preferring to stick  with his current management strategies.           10/07/2023    8:04 AM 08/25/2023    1:32 PM 05/06/2023    3:00 PM  Depression screen PHQ 2/9  Decreased Interest 1 1 0  Down, Depressed, Hopeless 1 3 0  PHQ - 2 Score 2 4 0  Altered sleeping 1 1 0  Tired, decreased energy 1 1 0  Change in appetite 1 1 0  Feeling bad or failure about yourself  1 1 0  Trouble concentrating 1 1 0  Moving slowly or fidgety/restless 1 3 0  Suicidal thoughts 1 0 0  PHQ-9 Score 9 12 0  Difficult doing work/chores  Somewhat difficult Not difficult at all       10/07/2023    8:05 AM 08/25/2023    1:32 PM 05/15/2022    8:22 AM 04/08/2021    2:35 PM  GAD 7 : Generalized Anxiety Score  Nervous, Anxious, on Edge 0 3 0 0  Control/stop worrying 1 3 1  0  Worry too much - different things 0 3 0 0  Trouble relaxing 0 3 1 0  Restless 0 3 2 0  Easily annoyed or irritable 0 1 1 0  Afraid - awful might happen 0 3 0 0  Total GAD 7 Score 1 19 5  0  Anxiety Difficulty  Somewhat difficult Not difficult at all Not difficult at all    Social History   Tobacco Use   Smoking status: Some Days    Current packs/day: 0.00    Types: Cigarettes,  E-cigarettes    Start date: 05/16/2021    Last attempt to quit: 05/23/2021    Years since quitting: 2.3   Smokeless tobacco: Never   Tobacco comments:    1 to 1.5 ppd 30-40 years.  Substance Use Topics   Alcohol use: No    Alcohol/week: 0.0 standard drinks of alcohol   Drug use: No    Review of Systems Per HPI unless specifically indicated above     Objective:    BP (!) 160/58   Pulse 68   Ht 5' 11 (1.803 m)   Wt 207 lb (93.9 kg)   SpO2 97%   BMI 28.87 kg/m   Wt Readings from Last 3 Encounters:  10/07/23 207 lb (93.9 kg)  08/25/23 203 lb 12.8 oz (92.4 kg)  05/22/22 179 lb (81.2 kg)    Physical Exam Vitals and nursing note reviewed.  Constitutional:      General: He is not in acute distress.    Appearance: He is well-developed. He is not diaphoretic.      Comments: Well-appearing, comfortable, cooperative  HENT:     Head: Normocephalic and atraumatic.  Eyes:     General:        Right eye: No discharge.        Left eye: No discharge.     Conjunctiva/sclera: Conjunctivae normal.  Neck:     Thyroid : No thyromegaly.  Cardiovascular:     Rate and Rhythm: Normal rate and regular rhythm.     Pulses: Normal pulses.     Heart sounds: Normal heart sounds. No murmur heard. Pulmonary:     Effort: Pulmonary effort is normal. No respiratory distress.     Breath sounds: Normal breath sounds. No wheezing or rales.  Musculoskeletal:        General: Normal range of motion.     Cervical back: Normal range of motion and neck supple.  Lymphadenopathy:     Cervical: No cervical adenopathy.  Skin:    General: Skin is warm and dry.     Findings: No erythema or rash.  Neurological:     Mental Status: He is alert and oriented to person, place, and time. Mental status is at baseline.  Psychiatric:        Behavior: Behavior normal.     Comments: Well groomed, good eye contact, normal speech and thoughts     Results for orders placed or performed in visit on 09/30/23  TSH   Collection Time: 09/30/23  8:28 AM  Result Value Ref Range   TSH 1.32 0.40 - 4.50 mIU/L  PSA   Collection Time: 09/30/23  8:28 AM  Result Value Ref Range   PSA 0.63 < OR = 4.00 ng/mL  CBC with Differential/Platelet   Collection Time: 09/30/23  8:28 AM  Result Value Ref Range   WBC 5.6 3.8 - 10.8 Thousand/uL   RBC 4.29 4.20 - 5.80 Million/uL   Hemoglobin 13.4 13.2 - 17.1 g/dL   HCT 58.9 61.4 - 49.9 %   MCV 95.6 80.0 - 100.0 fL   MCH 31.2 27.0 - 33.0 pg   MCHC 32.7 32.0 - 36.0 g/dL   RDW 87.8 88.9 - 84.9 %   Platelets 190 140 - 400 Thousand/uL   MPV 11.1 7.5 - 12.5 fL   Neutro Abs 3,618 1,500 - 7,800 cells/uL   Absolute Lymphocytes 1,417 850 - 3,900 cells/uL   Absolute Monocytes 386 200 - 950 cells/uL   Eosinophils Absolute 151 15 - 500 cells/uL  Basophils Absolute 28  0 - 200 cells/uL   Neutrophils Relative % 64.6 %   Total Lymphocyte 25.3 %   Monocytes Relative 6.9 %   Eosinophils Relative 2.7 %   Basophils Relative 0.5 %  COMPLETE METABOLIC PANEL WITH GFR   Collection Time: 09/30/23  8:28 AM  Result Value Ref Range   Glucose, Bld 83 65 - 99 mg/dL   BUN 30 (H) 7 - 25 mg/dL   Creat 8.81 9.29 - 8.64 mg/dL   eGFR 68 > OR = 60 fO/fpw/8.26f7   BUN/Creatinine Ratio 25 (H) 6 - 22 (calc)   Sodium 141 135 - 146 mmol/L   Potassium 5.0 3.5 - 5.3 mmol/L   Chloride 107 98 - 110 mmol/L   CO2 27 20 - 32 mmol/L   Calcium  9.4 8.6 - 10.3 mg/dL   Total Protein 6.7 6.1 - 8.1 g/dL   Albumin 4.4 3.6 - 5.1 g/dL   Globulin 2.3 1.9 - 3.7 g/dL (calc)   AG Ratio 1.9 1.0 - 2.5 (calc)   Total Bilirubin 0.4 0.2 - 1.2 mg/dL   Alkaline phosphatase (APISO) 53 35 - 144 U/L   AST 22 10 - 35 U/L   ALT 14 9 - 46 U/L  Lipid panel   Collection Time: 09/30/23  8:28 AM  Result Value Ref Range   Cholesterol 174 <200 mg/dL   HDL 65 > OR = 40 mg/dL   Triglycerides 51 <849 mg/dL   LDL Cholesterol (Calc) 96 mg/dL (calc)   Total CHOL/HDL Ratio 2.7 <5.0 (calc)   Non-HDL Cholesterol (Calc) 109 <130 mg/dL (calc)  Hemoglobin J8r   Collection Time: 09/30/23  8:28 AM  Result Value Ref Range   Hgb A1c MFr Bld 5.6 <5.7 % of total Hgb   Mean Plasma Glucose 114 mg/dL   eAG (mmol/L) 6.3 mmol/L      Assessment & Plan:   Problem List Items Addressed This Visit   None Visit Diagnoses       Psychophysiological insomnia    -  Primary   Relevant Medications   traZODone  (DESYREL ) 50 MG tablet     Muscle spasm of back       Relevant Medications   baclofen  (LIORESAL ) 10 MG tablet     Need for Streptococcus pneumoniae vaccination       Relevant Orders   Pneumococcal conjugate vaccine 20-valent (Completed)        Insomnia Improved with Trazodone  but noted decreased efficacy over time. Discussed the possibility of dose adjustment. -Continue Trazodone  50mg  at night. -Consider  increasing dose to 1.5 tablets as needed for sleep. New rx sent higher quantity  Restless Leg Syndrome OFF Ropinirole  d/t reaction side effect Improved on Trazodone  with better sleep -No further treatment planned at this time. Reconsider Pramipexole, Gabapentin  etc.  Chronic Back Pain Episodic back spasms, no significant injury history. Discussed non-pharmacological management and potential use of muscle relaxants. -Continue heat and ice therapy as needed. -Consider Baclofen  for severe spasms, printed rx provided to consider for now, he may fill if he is interested  General Health Maintenance -Administer Prevnar 20 vaccine today. -Continue current cholesterol management, as labs show improvement. -Follow up in one year for routine labs.         Orders Placed This Encounter  Procedures   Pneumococcal conjugate vaccine 20-valent    Meds ordered this encounter  Medications   traZODone  (DESYREL ) 50 MG tablet    Sig: Take 1-1.5 tablets (50-75 mg total) by mouth at  bedtime. For insomnia    Dispense:  90 tablet    Refill:  1    Dose and quantity change   baclofen  (LIORESAL ) 10 MG tablet    Sig: Take 0.5-1 tablets (5-10 mg total) by mouth 3 (three) times daily as needed for muscle spasms.    Dispense:  30 each    Refill:  2    Follow up plan: Return in about 1 year (around 10/06/2024) for 1 year fasting lab > 1 week later Annual Physical.  Future labs ordered for 09/2024   Marsa Officer, DO [facility] 10/07/2023, 8:13 AM

## 2023-10-07 NOTE — Patient Instructions (Addendum)
 Thank you for coming to the office today.  Prevnar-20 vaccine today.  Labs look good overall  Trazodone  re ordered to pharmacy, inc pill count, and adjusted instructions may take up to one and half tab if needed.  Remain off Ropinrole  Printed Rx Baclofen  For back spasms Start taking Baclofen  (Lioresal ) 10mg  (muscle relaxant) - start with half (cut) to one whole pill at night as needed for next 1-3 nights (may make you drowsy, caution with driving) see how it affects you, then if tolerated increase to one pill 2 to 3 times a day or (every 8 hours as needed)   Please schedule a Follow-up Appointment to: Return in about 1 year (around 10/06/2024) for 1 year fasting lab > 1 week later Annual Physical.  If you have any other questions or concerns, please feel free to call the office or send a message through MyChart. You may also schedule an earlier appointment if necessary.  Additionally, you may be receiving a survey about your experience at our office within a few days to 1 week by e-mail or mail. We value your feedback.  Marsa Officer, DO [facility], [place]

## 2023-11-09 DIAGNOSIS — I10 Essential (primary) hypertension: Secondary | ICD-10-CM | POA: Diagnosis not present

## 2023-11-09 DIAGNOSIS — H1045 Other chronic allergic conjunctivitis: Secondary | ICD-10-CM | POA: Diagnosis not present

## 2023-11-09 DIAGNOSIS — H2513 Age-related nuclear cataract, bilateral: Secondary | ICD-10-CM | POA: Diagnosis not present

## 2024-03-26 LAB — COLOGUARD

## 2024-04-07 ENCOUNTER — Other Ambulatory Visit: Payer: Self-pay | Admitting: Family Medicine

## 2024-04-07 DIAGNOSIS — F5104 Psychophysiologic insomnia: Secondary | ICD-10-CM

## 2024-04-07 NOTE — Telephone Encounter (Unsigned)
 Copied from CRM [id number]. Topic: Clinical - Medication Refill >> Apr 07, 2024  8:31 AM Larissa S wrote: Medication:traZODone  (DESYREL ) 50 MG tablet  Has the patient contacted their pharmacy? No (Agent: If no, request that the patient contact the pharmacy for the refill. If patient does not wish to contact the pharmacy document the reason why and proceed with request.) (Agent: If yes, when and what did the pharmacy advise?)  This is the patient's preferred pharmacy:  [facility] [address] [place] - [address] [place] [address] Phone: [phone] Fax: [phone]  Is this the correct pharmacy for this prescription? Yes If no, delete pharmacy and type the correct one.   Has the prescription been filled recently? No  Is the patient out of the medication? No  Has the patient been seen for an appointment in the last year OR does the patient have an upcoming appointment? Yes  Can we respond through MyChart? Yes  Agent: Please be advised that Rx refills may take up to 3 business days. We ask that you follow-up with your pharmacy.

## 2024-04-10 MED ORDER — TRAZODONE HCL 50 MG PO TABS
50.0000 mg | ORAL_TABLET | Freq: Every day | ORAL | 0 refills | Status: DC
Start: 2024-04-10 — End: 2024-06-05

## 2024-04-10 NOTE — Telephone Encounter (Signed)
 Requested Prescriptions  Pending Prescriptions Disp Refills   traZODone  (DESYREL ) 50 MG tablet 90 tablet 0    Sig: Take 1-1.5 tablets (50-75 mg total) by mouth at bedtime. For insomnia     Psychiatry: Antidepressants - Serotonin Modulator Failed - 04/10/2024  9:42 AM      Failed - Valid encounter within last 6 months    Recent Outpatient Visits   None

## 2024-04-11 ENCOUNTER — Telehealth: Payer: Self-pay

## 2024-04-11 NOTE — Telephone Encounter (Signed)
 Copied from CRM [id number]. Topic: Medical Record Request - Provider/Facility Request >> Apr 10, 2024 10:54 AM Precious C wrote: Reason for CRM: Tessa from [employer] called in regarding a medical records request faxed on April 07, 2024 for the pt. I checked the media tab and confirmed that only one page was received-(an authorization form). Orren stated the fax should have included five pages. I provided the  fax number and asked her to refax the full request. Please confirm receipt once all pages are received.

## 2024-05-11 ENCOUNTER — Ambulatory Visit: Payer: Medicare Other

## 2024-05-11 VITALS — Ht 71.0 in | Wt 200.0 lb

## 2024-05-11 DIAGNOSIS — Z0000 Encounter for general adult medical examination without abnormal findings: Secondary | ICD-10-CM

## 2024-05-11 NOTE — Patient Instructions (Addendum)
 Jesse Collier , Thank you for taking time out of your busy schedule to complete your Annual Wellness Visit with me. I enjoyed our conversation and look forward to speaking with you again next year. I, as well as your care team,  appreciate your ongoing commitment to your health goals. Please review the following plan we discussed and let me know if I can assist you in the future. Your Game plan/ To Do List    Referrals: If you haven't heard from the office you've been referred to, please reach out to them at the phone provided.   Follow up Visits: We will see or speak with you next year for your Next Medicare AWV with our clinical staff 05/25/25 1:20p  Have you seen your provider in the last 6 months (3 months if uncontrolled diabetes)?   Clinician Recommendations:  Aim for 30 minutes of exercise or brisk walking, 6-8 glasses of water, and 5 servings of fruits and vegetables each day.       This is a list of the screenings recommended for you:  Health Maintenance  Topic Date Due   Colon Cancer Screening  Never done   Zoster (Shingles) Vaccine (1 of 2) Never done   COVID-19 Vaccine (3 - 2024-25 season) 05/30/2023   Flu Shot  04/28/2024   Medicare Annual Wellness Visit  05/11/2025   DTaP/Tdap/Td vaccine (3 - Td or Tdap) 12/22/2029   Pneumococcal Vaccine for age over 20  Completed   Hepatitis C Screening  Completed   HPV Vaccine  Aged Out   Meningitis B Vaccine  Aged Out   Pneumococcal Vaccine  Discontinued    Advanced directives: (Copy Requested) Please bring a copy of your health care power of attorney and living will to the office to be added to your chart at your convenience. You can mail to [facility] [address] [place], [place] [address] or email to [email or web address] Advance Care Planning is important because it:  [x]  Makes sure you receive the medical care that is consistent with your values, goals, and preferences  [x]  It provides guidance to your  family and loved ones and reduces their decisional burden about whether or not they are making the right decisions based on your wishes.  Follow the link provided in your after visit summary or read over the paperwork we have mailed to you to help you started getting your Advance Directives in place. If you need assistance in completing these, please reach out to us  so that we can help you!  See attachments for Preventive Care and Fall Prevention Tips.

## 2024-05-11 NOTE — Progress Notes (Signed)
 Subjective:   Jesse Collier is a 68 y.o. who presents for a Medicare Wellness preventive visit.  As a reminder, Annual Wellness Visits don't include a physical exam, and some assessments may be limited, especially if this visit is performed virtually. We may recommend an in-person follow-up visit with your provider if needed.  Visit Complete: Virtual I connected with  Jesse Collier on 05/11/24 by a  enabled telemedicine application and verified that I am speaking with the correct person using two identifiers.  Patient Location: Home  Provider Location: Home Office  I discussed the limitations of evaluation and management by telemedicine. The patient expressed understanding and agreed to proceed.  Vital Signs: Because this visit was a virtual/telehealth visit, some criteria may be missing or patient reported. Any vitals not documented were not able to be obtained and vitals that have been documented are patient reported.    Persons Participating in Visit: Patient.  AWV Questionnaire: No: Patient Medicare AWV questionnaire was not completed prior to this visit.  Cardiac Risk Factors include: advanced age (>41men, >71 women);male gender;smoking/ tobacco exposure     Objective:    Today's Vitals   05/11/24 1312  Weight: 200 lb (90.7 kg)  Height: 5' 11 (1.803 m)   Body mass index is 27.89 kg/m.     05/11/2024    1:18 PM 05/06/2023    3:01 PM 02/06/2022    3:00 PM 11/13/2021    8:12 AM  Advanced Directives  Does Patient Have a Medical Advance Directive? Yes No No No  Type of Estate agent of [place];Living will     Copy of Healthcare Power of Attorney in Chart? No - copy requested     Would patient like information on creating a medical advance directive?  No - Patient declined No - Patient declined     Current Medications (verified) Outpatient Encounter Medications as of 05/11/2024  Medication Sig   baclofen  (LIORESAL ) 10 MG tablet Take 0.5-1 tablets  (5-10 mg total) by mouth 3 (three) times daily as needed for muscle spasms.   ibuprofen (ADVIL) 100 MG tablet Take 100 mg by mouth every 6 (six) hours as needed for fever.   traZODone  (DESYREL ) 50 MG tablet Take 1-1.5 tablets (50-75 mg total) by mouth at bedtime. For insomnia   No facility-administered encounter medications on file as of 05/11/2024.    Allergies (verified) Gabapentin    History: Past Medical History:  Diagnosis Date   Arthritis    BPH associated with nocturia 02/03/2021   DJD (degenerative joint disease) 02/03/2021   Neuropathy 08/28/2021   Numbness 08/28/2021   Pure hypercholesterolemia 03/05/2021   Past Surgical History:  Procedure Laterality Date   BOWEL RESECTION  2012   swallowed toothpick   CARPAL TUNNEL RELEASE Left    COLONOSCOPY  11/2013   ulcer of terminal ileum   KNEE ARTHROSCOPY Right    MEDIAL PARTIAL KNEE REPLACEMENT Right    Family History  Problem Relation Age of Onset   CAD Mother    Colon cancer Neg Hx    Prostate cancer Neg Hx    Bladder Cancer Neg Hx    Kidney cancer Neg Hx    Social History   Socioeconomic History   Marital status: Married    Spouse name: Not on file   Number of children: Not on file   Years of education: Not on file   Highest education level: Some college, no degree  Occupational History   Not on file  Tobacco  Use   Smoking status: Some Days    Current packs/day: 0.00    Types: Cigarettes, E-cigarettes    Start date: 05/16/2021    Last attempt to quit: 05/23/2021    Years since quitting: 2.9   Smokeless tobacco: Never   Tobacco comments:    1 to 1.5 ppd 30-40 years.  Substance and Sexual Activity   Alcohol use: No    Alcohol/week: 0.0 standard drinks of alcohol   Drug use: No   Sexual activity: Not on file  Other Topics Concern   Not on file  Social History Narrative   Not on file   Social Drivers of Health   Financial Resource Strain: Low Risk  (05/11/2024)   Overall Financial Resource Strain  (CARDIA)    Difficulty of Paying Living Expenses: Not hard at all  Food Insecurity: No Food Insecurity (05/11/2024)   Hunger Vital Sign    Worried About Running Out of Food in the Last Year: Never true    Ran Out of Food in the Last Year: Never true  Transportation Needs: No Transportation Needs (05/11/2024)   PRAPARE - Administrator, Civil Service (Medical): No    Lack of Transportation (Non-Medical): No  Physical Activity: Insufficiently Active (05/11/2024)   Exercise Vital Sign    Days of Exercise per Week: 5 days    Minutes of Exercise per Session: 20 min  Stress: No Stress Concern Present (05/11/2024)   [employer] of Occupational Health - Occupational Stress Questionnaire    Feeling of Stress: Not at all  Social Connections: Moderately Isolated (05/11/2024)   Social Connection and Isolation Panel    Frequency of Communication with Friends and Family: More than three times a week    Frequency of Social Gatherings with Friends and Family: More than three times a week    Attends Religious Services: Never    Database administrator or Organizations: No    Attends Banker Meetings: Never    Marital Status: Married    Tobacco Counseling Ready to quit: Yes Counseling given: Yes Tobacco comments: 1 to 1.5 ppd 30-40 years.    Clinical Intake:  Pre-visit preparation completed: Yes  Pain : No/denies pain     BMI - recorded: 27.89 Nutritional Status: BMI 25 -29 Overweight Nutritional Risks: None Diabetes: No  Lab Results  Component Value Date   HGBA1C 5.6 09/30/2023   HGBA1C 5.4 05/15/2022   HGBA1C 5.5 02/26/2021     How often do you need to have someone help you when you read instructions, pamphlets, or other written materials from your doctor or pharmacy?: 1 - Never  Interpreter Needed?: No  Information entered by :: Rojelio Blush LPN   Activities of Daily Living     05/11/2024    1:17 PM  In your present state of health, do you  have any difficulty performing the following activities:  Hearing? 0  Vision? 0  Difficulty concentrating or making decisions? 0  Walking or climbing stairs? 0  Dressing or bathing? 0  Doing errands, shopping? 0  Preparing Food and eating ? N  Using the Toilet? N  In the past six months, have you accidently leaked urine? N  Do you have problems with loss of bowel control? N  Managing your Medications? N  Managing your Finances? N  Housekeeping or managing your Housekeeping? N    Patient Care Team: Edman Marsa PARAS, DO as PCP - General (Family Medicine)  I have updated your  Care Teams any recent Medical Services you may have received from other providers in the past year.     Assessment:   This is a routine wellness examination for Jesse Collier.  Hearing/Vision screen Hearing Screening - Comments:: Denies hearing difficulties   Vision Screening - Comments:: Wears rx glasses - up to date with routine eye exams with  Dr Mevelyn   Goals Addressed               This Visit's Progress     Increase physical activity (pt-stated)        Lose weight.       Depression Screen     05/11/2024    1:16 PM 10/07/2023    8:04 AM 08/25/2023    1:32 PM 05/06/2023    3:00 PM 05/15/2022    8:21 AM 02/06/2022    2:58 PM 04/08/2021    2:34 PM  PHQ 2/9 Scores  PHQ - 2 Score 0 2 4 0 5 0 0  PHQ- 9 Score  9 12 0 10  0    Fall Risk     05/11/2024    1:17 PM 10/07/2023    8:04 AM 08/25/2023    1:32 PM 05/06/2023    3:02 PM 05/02/2023   11:58 AM  Fall Risk   Falls in the past year? 0 0 1 0 0  Number falls in past yr: 0  0 0 0  Injury with Fall? 0  0 0 0  Risk for fall due to : No Fall Risks   No Fall Risks   Follow up Falls evaluation completed   Falls prevention discussed;Falls evaluation completed     MEDICARE RISK AT HOME:  Medicare Risk at Home Any stairs in or around the home?: No If so, are there any without handrails?: No Home free of loose throw rugs in walkways, pet beds,  electrical cords, etc?: Yes Adequate lighting in your home to reduce risk of falls?: Yes Life alert?: No Use of a cane, walker or w/c?: No Grab bars in the bathroom?: Yes Shower chair or bench in shower?: No Elevated toilet seat or a handicapped toilet?: No  TIMED UP AND GO:  Was the test performed?  No  Cognitive Function: 6CIT completed        05/11/2024    1:18 PM 05/06/2023    3:03 PM 02/06/2022    3:02 PM  6CIT Screen  What Year? 0 points 0 points 0 points  What month? 0 points 0 points 0 points  What time? 0 points 0 points 0 points  Count back from 20 0 points 0 points 0 points  Months in reverse 0 points 0 points 0 points  Repeat phrase 0 points 0 points 2 points  Total Score 0 points 0 points 2 points    Immunizations Immunization History  Administered Date(s) Administered   PFIZER(Purple Top)SARS-COV-2 Vaccination 01/06/2020, 01/31/2020   PNEUMOCOCCAL CONJUGATE-20 10/07/2023   Tdap 09/29/2010, 12/23/2019    Screening Tests Health Maintenance  Topic Date Due   Colonoscopy  Never done   Zoster Vaccines- Shingrix  (1 of 2) Never done   COVID-19 Vaccine (3 - 2024-25 season) 05/30/2023   INFLUENZA VACCINE  04/28/2024   Medicare Annual Wellness (AWV)  05/11/2025   DTaP/Tdap/Td (3 - Td or Tdap) 12/22/2029   Pneumococcal Vaccine: 50+ Years  Completed   Hepatitis C Screening  Completed   HPV VACCINES  Aged Out   Meningococcal B Vaccine  Aged Out  Pneumococcal Vaccine  Discontinued    Health Maintenance  Health Maintenance Due  Topic Date Due   Colonoscopy  Never done   Zoster Vaccines- Shingrix  (1 of 2) Never done   COVID-19 Vaccine (3 - 2024-25 season) 05/30/2023   INFLUENZA VACCINE  04/28/2024   Health Maintenance Items Addressed: Colonoscopy Deferred   Additional Screening:  Vision Screening: Recommended annual ophthalmology exams for early detection of glaucoma and other disorders of the eye. Would you like a referral to an eye doctor? No     Dental Screening: Recommended annual dental exams for proper oral hygiene  Community Resource Referral / Chronic Care Management: CRR required this visit?  No   CCM required this visit?  No   Plan:    I have personally reviewed and noted the following in the patient's chart:   Medical and social history Use of alcohol, tobacco or illicit drugs  Current medications and supplements including opioid prescriptions. Patient is not currently taking opioid prescriptions. Functional ability and status Nutritional status Physical activity Advanced directives List of other physicians Hospitalizations, surgeries, and ER visits in previous 12 months Vitals Screenings to include cognitive, depression, and falls Referrals and appointments  In addition, I have reviewed and discussed with patient certain preventive protocols, quality metrics, and best practice recommendations. A written personalized care plan for preventive services as well as general preventive health recommendations were provided to patient.   Rojelio LELON Blush, LPN   1/85/7974   After Visit Summary: (MyChart) Due to this being a telephonic visit, the after visit summary with patients personalized plan was offered to patient via MyChart   Notes: Nothing significant to report at this time.

## 2024-06-03 ENCOUNTER — Other Ambulatory Visit: Payer: Self-pay | Admitting: Family Medicine

## 2024-06-03 DIAGNOSIS — F5104 Psychophysiologic insomnia: Secondary | ICD-10-CM

## 2024-06-05 NOTE — Telephone Encounter (Signed)
 Requested Prescriptions  Pending Prescriptions Disp Refills   traZODone  (DESYREL ) 50 MG tablet [Pharmacy Med Name: traZODone  HCl 50 MG Oral Tablet] 30 tablet 0    Sig: TAKE 1 TO 1 & 1/2 (ONE TO ONE & ONE-HALF) TABLETS BY MOUTH AT BEDTIME FOR INSOMNIA     Psychiatry: Antidepressants - Serotonin Modulator Passed - 06/05/2024  1:46 PM      Passed - Valid encounter within last 6 months    Recent Outpatient Visits   None

## 2024-07-11 NOTE — Progress Notes (Signed)
 Josia Cueva                                          MRN: [id number]   07/11/2024   The [facility] Quality Team Specialist reviewed this patient medical record for the purposes of chart review for care gap closure. The following were reviewed: chart review for care gap closure-colorectal cancer screening.  Noncompliant, Cologuard sample could not be processed 03/26/2024.    [facility] Quality Team

## 2024-08-17 ENCOUNTER — Other Ambulatory Visit: Payer: Self-pay | Admitting: Family Medicine

## 2024-08-17 DIAGNOSIS — F5104 Psychophysiologic insomnia: Secondary | ICD-10-CM

## 2024-08-17 NOTE — Telephone Encounter (Signed)
 Copied from CRM [id number]. Topic: Clinical - Medication Refill >> Aug 17, 2024  3:40 PM Delon T wrote: Medication: traZODone  (DESYREL ) 50 MG tablet  Has the patient contacted their pharmacy? Yes (Agent: If no, request that the patient contact the pharmacy for the refill. If patient does not wish to contact the pharmacy document the reason why and proceed with request.) (Agent: If yes, when and what did the pharmacy advise?)  This is the patient's preferred pharmacy:  [facility] [address], [place] - [address] [place] [address] Phone: [phone] Fax: [phone]  Is this the correct pharmacy for this prescription? Yes If no, delete pharmacy and type the correct one.   Has the prescription been filled recently? Yes  Is the patient out of the medication? No  Has the patient been seen for an appointment in the last year OR does the patient have an upcoming appointment? Yes  Can we respond through MyChart? Yes  Agent: Please be advised that Rx refills may take up to 3 business days. We ask that you follow-up with your pharmacy.

## 2024-08-19 MED ORDER — TRAZODONE HCL 50 MG PO TABS: 50.0000 mg | ORAL_TABLET | Freq: Every evening | ORAL | 0 refills | Status: DC | PRN

## 2024-08-19 NOTE — Telephone Encounter (Signed)
 Requested Prescriptions  Pending Prescriptions Disp Refills   traZODone  (DESYREL ) 50 MG tablet 90 tablet 0    Sig: Take 1 tablet (50 mg total) by mouth at bedtime as needed for sleep. TAKE 1 TO 1 & 1/2 (ONE TO ONE & ONE-HALF) TABLETS BY MOUTH AT BEDTIME FOR INSOMNIA. OFFICE VISIT NEEDED FOR ADDITIONAL REFILLS     Psychiatry: Antidepressants - Serotonin Modulator Passed - 08/19/2024 10:25 AM      Passed - Valid encounter within last 6 months    Recent Outpatient Visits   None

## 2024-10-12 ENCOUNTER — Other Ambulatory Visit: Payer: Self-pay | Admitting: Family Medicine

## 2024-10-12 DIAGNOSIS — F5104 Psychophysiologic insomnia: Secondary | ICD-10-CM

## 2024-10-13 NOTE — Telephone Encounter (Signed)
 Requested Prescriptions  Pending Prescriptions Disp Refills   traZODone  (DESYREL ) 50 MG tablet [Pharmacy Med Name: traZODone  HCl 50 MG Oral Tablet] 90 tablet 0    Sig: TAKE 1-1.5 TABLET BY MOUTH AT BEDTIME FOR INSOMNIA AS NEEDED. OFFICE VISIT NEEDED FOR ADDITIONAL REFILL     Psychiatry: Antidepressants - Serotonin Modulator Passed - 10/13/2024 10:34 AM      Passed - Valid encounter within last 6 months    Recent Outpatient Visits   None

## 2025-05-25 ENCOUNTER — Ambulatory Visit

## 2025-05-30 ENCOUNTER — Ambulatory Visit
